# Patient Record
Sex: Female | Born: 1975 | Race: Black or African American | Hispanic: No | Marital: Single | Smoking: Current every day smoker
Health system: Southern US, Community
[De-identification: ages and names within clinical notes are randomized; demographics above are authoritative.]

## PROBLEM LIST (undated history)

## (undated) DIAGNOSIS — G4700 Insomnia, unspecified: Secondary | ICD-10-CM

## (undated) DIAGNOSIS — I10 Essential (primary) hypertension: Secondary | ICD-10-CM

## (undated) DIAGNOSIS — D219 Benign neoplasm of connective and other soft tissue, unspecified: Secondary | ICD-10-CM

## (undated) DIAGNOSIS — F32A Depression, unspecified: Secondary | ICD-10-CM

## (undated) DIAGNOSIS — F329 Major depressive disorder, single episode, unspecified: Secondary | ICD-10-CM

## (undated) DIAGNOSIS — K439 Ventral hernia without obstruction or gangrene: Secondary | ICD-10-CM

## (undated) DIAGNOSIS — E559 Vitamin D deficiency, unspecified: Secondary | ICD-10-CM

## (undated) HISTORY — DX: Vitamin D deficiency, unspecified: E55.9

## (undated) HISTORY — PX: TUBAL LIGATION: SHX77

## (undated) HISTORY — DX: Insomnia, unspecified: G47.00

## (undated) HISTORY — PX: ABDOMINAL HYSTERECTOMY: SHX81

## (undated) HISTORY — DX: Benign neoplasm of connective and other soft tissue, unspecified: D21.9

## (undated) HISTORY — DX: Essential (primary) hypertension: I10

## (undated) HISTORY — PX: OTHER SURGICAL HISTORY: SHX169

## (undated) HISTORY — DX: Depression, unspecified: F32.A

## (undated) HISTORY — DX: Major depressive disorder, single episode, unspecified: F32.9

---

## 2005-06-17 ENCOUNTER — Emergency Department: Payer: Self-pay | Admitting: Emergency Medicine

## 2005-06-26 ENCOUNTER — Emergency Department: Payer: Self-pay | Admitting: Emergency Medicine

## 2006-05-06 ENCOUNTER — Emergency Department: Payer: Self-pay | Admitting: Emergency Medicine

## 2007-11-22 ENCOUNTER — Emergency Department: Payer: Self-pay | Admitting: Emergency Medicine

## 2009-04-09 ENCOUNTER — Emergency Department: Payer: Self-pay | Admitting: Emergency Medicine

## 2013-10-31 ENCOUNTER — Ambulatory Visit: Payer: Self-pay | Admitting: Emergency Medicine

## 2013-10-31 LAB — RAPID INFLUENZA A&B ANTIGENS

## 2015-02-01 ENCOUNTER — Encounter: Payer: Self-pay | Admitting: Family Medicine

## 2015-02-01 ENCOUNTER — Ambulatory Visit (INDEPENDENT_AMBULATORY_CARE_PROVIDER_SITE_OTHER): Payer: BLUE CROSS/BLUE SHIELD | Admitting: Family Medicine

## 2015-02-01 VITALS — BP 138/96 | HR 96 | Temp 98.4°F | Resp 18 | Ht 65.25 in | Wt 203.1 lb

## 2015-02-01 DIAGNOSIS — E669 Obesity, unspecified: Secondary | ICD-10-CM

## 2015-02-01 DIAGNOSIS — G4700 Insomnia, unspecified: Secondary | ICD-10-CM

## 2015-02-01 DIAGNOSIS — R809 Proteinuria, unspecified: Secondary | ICD-10-CM | POA: Insufficient documentation

## 2015-02-01 DIAGNOSIS — R102 Pelvic and perineal pain: Secondary | ICD-10-CM | POA: Diagnosis not present

## 2015-02-01 DIAGNOSIS — R030 Elevated blood-pressure reading, without diagnosis of hypertension: Secondary | ICD-10-CM | POA: Diagnosis not present

## 2015-02-01 DIAGNOSIS — F329 Major depressive disorder, single episode, unspecified: Secondary | ICD-10-CM | POA: Insufficient documentation

## 2015-02-01 DIAGNOSIS — R5383 Other fatigue: Secondary | ICD-10-CM

## 2015-02-01 DIAGNOSIS — R9431 Abnormal electrocardiogram [ECG] [EKG]: Secondary | ICD-10-CM | POA: Diagnosis not present

## 2015-02-01 DIAGNOSIS — IMO0001 Reserved for inherently not codable concepts without codable children: Secondary | ICD-10-CM | POA: Insufficient documentation

## 2015-02-01 DIAGNOSIS — F419 Anxiety disorder, unspecified: Secondary | ICD-10-CM

## 2015-02-01 DIAGNOSIS — F418 Other specified anxiety disorders: Secondary | ICD-10-CM

## 2015-02-01 DIAGNOSIS — Z113 Encounter for screening for infections with a predominantly sexual mode of transmission: Secondary | ICD-10-CM

## 2015-02-01 DIAGNOSIS — Z79899 Other long term (current) drug therapy: Secondary | ICD-10-CM

## 2015-02-01 DIAGNOSIS — E66811 Obesity, class 1: Secondary | ICD-10-CM | POA: Insufficient documentation

## 2015-02-01 DIAGNOSIS — F32A Depression, unspecified: Secondary | ICD-10-CM | POA: Insufficient documentation

## 2015-02-01 LAB — POCT UA - MICROALBUMIN: Microalbumin Ur, POC: 50 mg/L

## 2015-02-01 MED ORDER — CITALOPRAM HYDROBROMIDE 20 MG PO TABS: 20.0000 mg | ORAL_TABLET | Freq: Every day | ORAL | Status: DC

## 2015-02-01 MED ORDER — TRAZODONE HCL 50 MG PO TABS: 25.0000 mg | ORAL_TABLET | Freq: Every evening | ORAL | Status: DC | PRN

## 2015-02-01 NOTE — Patient Instructions (Signed)
Nicotine Addiction Nicotine can act as both a stimulant (excites/activates) and a sedative (calms/quiets). Immediately after exposure to nicotine, there is a "kick" caused in part by the drug's stimulation of the adrenal glands and resulting discharge of adrenaline (epinephrine). The rush of adrenaline stimulates the body and causes a sudden release of sugar. This means that smokers are always slightly hyperglycemic. Hyperglycemic means that the blood sugar is high, just like in diabetics. Nicotine also decreases the amount of insulin which helps control sugar levels in the body. There is an increase in blood pressure, breathing, and the rate of heart beats.  In addition, nicotine indirectly causes a release of dopamine in the brain that controls pleasure and motivation. A similar reaction is seen with other drugs of abuse, such as cocaine and heroin. This dopamine release is thought to cause the pleasurable sensations when smoking. In some different cases, nicotine can also create a calming effect, depending on sensitivity of the smoker's nervous system and the dose of nicotine taken. WHAT HAPPENS WHEN NICOTINE IS TAKEN FOR LONG PERIODS OF TIME?  Long-term use of nicotine results in addiction. It is difficult to stop.  Repeated use of nicotine creates tolerance. Higher doses of nicotine are needed to get the "kick." When nicotine use is stopped, withdrawal may last a month or more. Withdrawal may begin within a few hours after the last cigarette. Symptoms peak within the first few days and may lessen within a few weeks. For some people, however, symptoms may last for months or longer. Withdrawal symptoms include:   Irritability.  Craving.  Learning and attention deficits.  Sleep disturbances.  Increased appetite. Craving for tobacco may last for 6 months or longer. Many behaviors done while using nicotine can also play a part in the severity of withdrawal symptoms. For some people, the feel,  smell, and sight of a cigarette and the ritual of obtaining, handling, lighting, and smoking the cigarette are closely linked with the pleasure of smoking. When stopped, they also miss the related behaviors which make the withdrawal or craving worse. While nicotine gum and patches may lessen the drug aspects of withdrawal, cravings often persist. WHAT ARE THE MEDICAL CONSEQUENCES OF NICOTINE USE?  Nicotine addiction accounts for one-third of all cancers. The top cancer caused by tobacco is lung cancer. Lung cancer is the number one cancer killer of both men and women.  Smoking is also associated with cancers of the:  Mouth.  Pharynx.  Larynx.  Esophagus.  Stomach.  Pancreas.  Cervix.  Kidney.  Ureter.  Bladder.  Smoking also causes lung diseases such as lasting (chronic) bronchitis and emphysema.  It worsens asthma in adults and children.  Smoking increases the risk of heart disease, including:  Stroke.  Heart attack.  Vascular disease.  Aneurysm.  Passive or secondary smoke can also increase medical risks including:  Asthma in children.  Sudden Infant Death Syndrome (SIDS).  Additionally, dropped cigarettes are the leading cause of residential fire fatalities.  Nicotine poisoning has been reported from accidental ingestion of tobacco products by children and pets. Death usually results in a few minutes from respiratory failure (when a person stops breathing) caused by paralysis. TREATMENT   Medication. Nicotine replacement medicines such as nicotine gum and the patch are used to stop smoking. These medicines gradually lower the dosage of nicotine in the body. These medicines do not contain the carbon monoxide and other toxins found in tobacco smoke.  Hypnotherapy.  Relaxation therapy.  Nicotine Anonymous (a 12-step support   program). Find times and locations in your local yellow pages. Document Released: 03/25/2004 Document Revised: 10/12/2011 Document  Reviewed: 09/15/2013 [employer] Patient Information 2015 [employer]. This information is not intended to replace advice given to you by your health care provider. Make sure you discuss any questions you have with your health care provider.  

## 2015-02-01 NOTE — Progress Notes (Signed)
Name: Theresa Sandoval   MRN: [id number]    DOB: 1976-04-08   Date:02/01/2015       Progress Note  Subjective  Chief Complaint  Chief Complaint  Patient presents with  . Mass    In Abdomen-middle of last year, pressure when touching    HPI  Pelvic Mass: she states that about one year ago she noticed a mass in her lower abdomen. It is tender to touch, slow growth, cycles are regular, lasts about 5 -6 days, medium flow, LMP started 2 weeks ago and was a little lighter than usual. Family positive for fibroid tumor (mother )  Depression/Anxiety: she states two years ago , the father of both of her children was cheating on her and she told him to leave.  They lived together for 14 years, she feels overwhelmed by raising two children by herself and hurt by the betrayal. She feels sad, has crying spells, no energy, denies feeling angry, but feels sad.  Insomnia: she can fall asleep but can't stay asleep, symptoms going on for the past couple of years also  Obesity: she states she usually does not weigh herself. She was not obese until adulthood.   Elevated BP: she states her bp has not been high in the past, but was nervous coming here.   Patient Active Problem List   Diagnosis Date Noted  . Obesity (BMI 30.0-34.9) 02/01/2015    Past Surgical History  Procedure Laterality Date  . Tubal ligation      Family History  Problem Relation Age of Onset  . Hypertension Mother   . Hyperlipidemia Mother     History   Social History  . Marital Status: Single    Spouse Name: N/A  . Number of Children: N/A  . Years of Education: N/A   Occupational History  . Not on file.   Social History Main Topics  . Smoking status: Current Every Day Smoker -- 15 years    Types: Cigarettes    Start date: 02/01/2000  . Smokeless tobacco: Never Used  . Alcohol Use: 2.4 oz/week    4 Standard drinks or equivalent per week     Comment: wine/beer   . Drug Use: No  . Sexual Activity:    Partners:  Male    Birth Control/ Protection: Other-see comments     Comment: Tubal ligation   Other Topics Concern  . Not on file   Social History Narrative  . No narrative on file     Current outpatient prescriptions:  .  citalopram (CELEXA) 20 MG tablet, Take 1 tablet (20 mg total) by mouth daily., Disp: 30 tablet, Rfl: 1 .  traZODone (DESYREL) 50 MG tablet, Take 0.5-1 tablets (25-50 mg total) by mouth at bedtime as needed for sleep., Disp: 30 tablet, Rfl: 1  No Known Allergies   ROS  Constitutional: Negative for fever or weight change.  Positive for fatigue Respiratory: Negative for cough and shortness of breath.   Cardiovascular: Negative for chest pain or palpitations.  Gastrointestinal: Negative for bowel changes, positive for a mass.  Musculoskeletal: Negative for gait problem or joint swelling.  Skin: Negative for rash.  Neurological: Negative for dizziness or headache.  No other specific complaints in a complete review of systems (except as listed in HPI above).  Objective  Filed Vitals:   02/01/15 1459  BP: 138/96  Pulse: 96  Temp: 98.4 F (36.9 C)  TempSrc: Oral  Resp: 18  Height: 5' 5.25" (1.657 m)  Weight:  203 lb 1.6 oz (92.126 kg)  SpO2: 96%    Body mass index is 33.55 kg/(m^2).  Physical Exam  Constitutional: Patient appears well-developed and well-nourished. No distress. Obese Eyes:  No scleral icterus. PERL Neck: Normal range of motion. Neck supple. Cardiovascular: Normal rate, regular rhythm and normal heart sounds.  No murmur heard. No BLE edema. Pulmonary/Chest: Effort normal and breath sounds normal. No respiratory distress. Abdominal: Soft.  There is a large , palpable mass on right lower quadrant that moves to supra pubic area, tender to touch, firm in consistency. Psychiatric: Patient has a normal affect. behavior is normal. Judgment and thought content normal. Sad and cried during her visit    PHQ2/9: Depression screen [facility] 2/9 02/01/2015  02/01/2015  Decreased Interest 3 0  Down, Depressed, Hopeless 2 0  PHQ - 2 Score 5 0  Altered sleeping 3 -  Tired, decreased energy 3 -  Change in appetite 0 -  Feeling bad or failure about yourself  0 -  Trouble concentrating 0 -  Moving slowly or fidgety/restless 0 -  Suicidal thoughts 0 -  PHQ-9 Score 11 -  Difficult doing work/chores Somewhat difficult -     Fall Risk: Fall Risk  02/01/2015  Falls in the past year? No     Assessment & Plan  1. Pelvic pain in female  - US Pelvis Complete; Future  2. Obesity (BMI 30.0-34.9) Change life style   3. Other fatigue  - Vitamin D (25 hydroxy) - B12  4. Screen for STD (sexually transmitted disease)  - Lipid Profile - HIV antibody (with reflex) - GC/chlamydia probe amp, urine - RPR  5. Depression with anxiety  - citalopram (CELEXA) 20 MG tablet; Take 1 tablet (20 mg total) by mouth daily.  Dispense: 30 tablet; Refill: 1  6. Elevated blood pressure Patient denies hypertension, but advised to check bp at local pharmacy and to return sooner if it remains above 140/90. We will check labs   - Comprehensive Metabolic Panel (CMET) - TSH - CBC - EKG 12-Lead  7. Insomnia  - traZODone (DESYREL) 50 MG tablet; Take 0.5-1 tablets (25-50 mg total) by mouth at bedtime as needed for sleep.  Dispense: 30 tablet; Refill: 1  8. High risk medication use Trazodone and Celexa can prolong QT - EKG 12-Lead   9. Abnormal EKG Q wave on septal lead, no previous history of chest pain, asymptomatic today, no family history of early MI, discussed referral to cardiologist or montior bp and she chose the later for now   10. Microalbuminuria She had elevated bp also, needs to monitor bp , stop smoking and recheck next visit

## 2015-02-02 LAB — LIPID PANEL
CHOL/HDL RATIO: 4.3 ratio (ref 0.0–4.4)
CHOLESTEROL TOTAL: 185 mg/dL (ref 100–199)
HDL: 43 mg/dL (ref >39)
LDL Calculated: 103 mg/dL — ABNORMAL HIGH (ref 0–99)
TRIGLYCERIDES: 197 mg/dL — AB (ref 0–149)
VLDL CHOLESTEROL CAL: 39 mg/dL (ref 5–40)

## 2015-02-02 LAB — CBC
HEMATOCRIT: 40.2 % (ref 34.0–46.6)
HEMOGLOBIN: 13.7 g/dL (ref 11.1–15.9)
MCH: 32.7 pg (ref 26.6–33.0)
MCHC: 34.1 g/dL (ref 31.5–35.7)
MCV: 96 fL (ref 79–97)
Platelets: 271 10*3/uL (ref 150–379)
RBC: 4.19 x10E6/uL (ref 3.77–5.28)
RDW: 13.8 % (ref 12.3–15.4)
WBC: 10.1 10*3/uL (ref 3.4–10.8)

## 2015-02-02 LAB — COMPREHENSIVE METABOLIC PANEL
ALK PHOS: 67 IU/L (ref 39–117)
ALT: 12 IU/L (ref 0–32)
AST: 15 IU/L (ref 0–40)
Albumin/Globulin Ratio: 1.5 (ref 1.1–2.5)
Albumin: 4.2 g/dL (ref 3.5–5.5)
BUN / CREAT RATIO: 9 (ref 8–20)
BUN: 9 mg/dL (ref 6–20)
Bilirubin Total: 0.5 mg/dL (ref 0.0–1.2)
CALCIUM: 9.3 mg/dL (ref 8.7–10.2)
CO2: 24 mmol/L (ref 18–29)
Chloride: 99 mmol/L (ref 97–108)
Creatinine, Ser: 0.98 mg/dL (ref 0.57–1.00)
GFR calc Af Amer: 85 mL/min/{1.73_m2} (ref >59)
GFR calc non Af Amer: 73 mL/min/{1.73_m2} (ref >59)
Globulin, Total: 2.8 g/dL (ref 1.5–4.5)
Glucose: 101 mg/dL — ABNORMAL HIGH (ref 65–99)
Potassium: 4.3 mmol/L (ref 3.5–5.2)
Sodium: 137 mmol/L (ref 134–144)
Total Protein: 7 g/dL (ref 6.0–8.5)

## 2015-02-02 LAB — VITAMIN B12: Vitamin B-12: 407 pg/mL (ref 211–946)

## 2015-02-02 LAB — VITAMIN D 25 HYDROXY (VIT D DEFICIENCY, FRACTURES): Vit D, 25-Hydroxy: 10 ng/mL — ABNORMAL LOW (ref 30.0–100.0)

## 2015-02-02 LAB — TSH: TSH: 0.699 u[IU]/mL (ref 0.450–4.500)

## 2015-02-02 LAB — RPR: RPR Ser Ql: NONREACTIVE

## 2015-02-02 LAB — HIV ANTIBODY (ROUTINE TESTING W REFLEX): HIV Screen 4th Generation wRfx: NONREACTIVE

## 2015-02-03 ENCOUNTER — Other Ambulatory Visit: Payer: Self-pay | Admitting: Family Medicine

## 2015-02-03 DIAGNOSIS — R739 Hyperglycemia, unspecified: Secondary | ICD-10-CM

## 2015-02-05 ENCOUNTER — Other Ambulatory Visit: Payer: Self-pay | Admitting: Family Medicine

## 2015-02-05 DIAGNOSIS — E559 Vitamin D deficiency, unspecified: Secondary | ICD-10-CM

## 2015-02-05 MED ORDER — VITAMIN D (ERGOCALCIFEROL) 1.25 MG (50000 UNIT) PO CAPS: 50000.0000 [IU] | ORAL_CAPSULE | ORAL | Status: DC

## 2015-02-05 NOTE — Progress Notes (Signed)
Patient notified of Vitamin D prescription and lab results. Patient states she is not checking her BP at home and informed her due to her Protein being high in her urine, Dr. Ancil Boozer would like her to keep a check on it and call us back with her BP results.

## 2015-02-05 NOTE — Progress Notes (Signed)
Added on Lab to [place].

## 2015-02-06 LAB — SPECIMEN STATUS REPORT

## 2015-02-06 LAB — HGB A1C W/O EAG: Hgb A1c MFr Bld: 5.7 % — ABNORMAL HIGH (ref 4.8–5.6)

## 2015-02-08 LAB — SPECIMEN STATUS REPORT

## 2015-02-08 LAB — GC/CHLAMYDIA PROBE AMP
Chlamydia trachomatis, NAA: NEGATIVE
Neisseria gonorrhoeae by PCR: NEGATIVE

## 2015-02-11 ENCOUNTER — Telehealth: Payer: Self-pay

## 2015-02-11 NOTE — Telephone Encounter (Signed)
Left patient vm to return my call for her labs

## 2015-02-11 NOTE — Telephone Encounter (Signed)
-----   Message from Steele Sizer, MD sent at 02/10/2015  5:02 PM EDT ----- genprobe negative

## 2015-02-12 NOTE — Progress Notes (Signed)
Patient notified

## 2015-02-13 ENCOUNTER — Ambulatory Visit: Payer: BLUE CROSS/BLUE SHIELD

## 2015-02-19 ENCOUNTER — Other Ambulatory Visit: Payer: Self-pay | Admitting: Family Medicine

## 2015-02-19 ENCOUNTER — Ambulatory Visit
Admission: RE | Admit: 2015-02-19 | Discharge: 2015-02-19 | Disposition: A | Discharge status: Home / Self Care | Payer: BLUE CROSS/BLUE SHIELD | Source: Ambulatory Visit | Attending: Family Medicine | Admitting: Family Medicine

## 2015-02-19 DIAGNOSIS — N888 Other specified noninflammatory disorders of cervix uteri: Secondary | ICD-10-CM | POA: Insufficient documentation

## 2015-02-19 DIAGNOSIS — Z86018 Personal history of other benign neoplasm: Secondary | ICD-10-CM | POA: Insufficient documentation

## 2015-02-19 DIAGNOSIS — D259 Leiomyoma of uterus, unspecified: Secondary | ICD-10-CM | POA: Insufficient documentation

## 2015-02-19 DIAGNOSIS — R102 Pelvic and perineal pain: Secondary | ICD-10-CM | POA: Diagnosis present

## 2015-02-19 DIAGNOSIS — R739 Hyperglycemia, unspecified: Secondary | ICD-10-CM

## 2015-02-20 ENCOUNTER — Telehealth: Payer: Self-pay

## 2015-02-20 NOTE — Telephone Encounter (Signed)
Pt notified please send referral to [facility]

## 2015-03-15 ENCOUNTER — Encounter: Payer: Self-pay | Admitting: Family Medicine

## 2015-03-15 ENCOUNTER — Ambulatory Visit (INDEPENDENT_AMBULATORY_CARE_PROVIDER_SITE_OTHER): Payer: BLUE CROSS/BLUE SHIELD | Admitting: Family Medicine

## 2015-03-15 DIAGNOSIS — G4700 Insomnia, unspecified: Secondary | ICD-10-CM

## 2015-03-15 DIAGNOSIS — R7303 Prediabetes: Secondary | ICD-10-CM | POA: Insufficient documentation

## 2015-03-15 DIAGNOSIS — R7309 Other abnormal glucose: Secondary | ICD-10-CM

## 2015-03-15 DIAGNOSIS — Z720 Tobacco use: Secondary | ICD-10-CM

## 2015-03-15 DIAGNOSIS — E559 Vitamin D deficiency, unspecified: Secondary | ICD-10-CM | POA: Diagnosis not present

## 2015-03-15 DIAGNOSIS — D259 Leiomyoma of uterus, unspecified: Secondary | ICD-10-CM | POA: Diagnosis not present

## 2015-03-15 DIAGNOSIS — Z23 Encounter for immunization: Secondary | ICD-10-CM

## 2015-03-15 DIAGNOSIS — F418 Other specified anxiety disorders: Secondary | ICD-10-CM

## 2015-03-15 MED ORDER — TRAZODONE HCL 50 MG PO TABS: 25.0000 mg | ORAL_TABLET | Freq: Every evening | ORAL | Status: DC | PRN

## 2015-03-15 MED ORDER — CITALOPRAM HYDROBROMIDE 20 MG PO TABS: 20.0000 mg | ORAL_TABLET | Freq: Every day | ORAL | Status: DC

## 2015-03-15 MED ORDER — BUPROPION HCL ER (XL) 150 MG PO TB24: 150.0000 mg | ORAL_TABLET | Freq: Every day | ORAL | Status: DC

## 2015-03-15 NOTE — Patient Instructions (Signed)
Start Wellbutrin and pick a quit date about two weeks from now, discuss with family and friends for a support system  Nicotine Addiction Nicotine can act as both a stimulant (excites/activates) and a sedative (calms/quiets). Immediately after exposure to nicotine, there is a "kick" caused in part by the drug's stimulation of the adrenal glands and resulting discharge of adrenaline (epinephrine). The rush of adrenaline stimulates the body and causes a sudden release of sugar. This means that smokers are always slightly hyperglycemic. Hyperglycemic means that the blood sugar is high, just like in diabetics. Nicotine also decreases the amount of insulin which helps control sugar levels in the body. There is an increase in blood pressure, breathing, and the rate of heart beats.  In addition, nicotine indirectly causes a release of dopamine in the brain that controls pleasure and motivation. A similar reaction is seen with other drugs of abuse, such as cocaine and heroin. This dopamine release is thought to cause the pleasurable sensations when smoking. In some different cases, nicotine can also create a calming effect, depending on sensitivity of the smoker's nervous system and the dose of nicotine taken. WHAT HAPPENS WHEN NICOTINE IS TAKEN FOR LONG PERIODS OF TIME?  Long-term use of nicotine results in addiction. It is difficult to stop.  Repeated use of nicotine creates tolerance. Higher doses of nicotine are needed to get the "kick." When nicotine use is stopped, withdrawal may last a month or more. Withdrawal may begin within a few hours after the last cigarette. Symptoms peak within the first few days and may lessen within a few weeks. For some people, however, symptoms may last for months or longer. Withdrawal symptoms include:   Irritability.  Craving.  Learning and attention deficits.  Sleep disturbances.  Increased appetite. Craving for tobacco may last for 6 months or longer. Many  behaviors done while using nicotine can also play a part in the severity of withdrawal symptoms. For some people, the feel, smell, and sight of a cigarette and the ritual of obtaining, handling, lighting, and smoking the cigarette are closely linked with the pleasure of smoking. When stopped, they also miss the related behaviors which make the withdrawal or craving worse. While nicotine gum and patches may lessen the drug aspects of withdrawal, cravings often persist. WHAT ARE THE MEDICAL CONSEQUENCES OF NICOTINE USE?  Nicotine addiction accounts for one-third of all cancers. The top cancer caused by tobacco is lung cancer. Lung cancer is the number one cancer killer of both men and women.  Smoking is also associated with cancers of the:  Mouth.  Pharynx.  Larynx.  Esophagus.  Stomach.  Pancreas.  Cervix.  Kidney.  Ureter.  Bladder.  Smoking also causes lung diseases such as lasting (chronic) bronchitis and emphysema.  It worsens asthma in adults and children.  Smoking increases the risk of heart disease, including:  Stroke.  Heart attack.  Vascular disease.  Aneurysm.  Passive or secondary smoke can also increase medical risks including:  Asthma in children.  Sudden Infant Death Syndrome (SIDS).  Additionally, dropped cigarettes are the leading cause of residential fire fatalities.  Nicotine poisoning has been reported from accidental ingestion of tobacco products by children and pets. Death usually results in a few minutes from respiratory failure (when a person stops breathing) caused by paralysis. TREATMENT   Medication. Nicotine replacement medicines such as nicotine gum and the patch are used to stop smoking. These medicines gradually lower the dosage of nicotine in the body. These medicines do not  contain the carbon monoxide and other toxins found in tobacco smoke.  Hypnotherapy.  Relaxation therapy.  Nicotine Anonymous (a 12-step support program).  Find times and locations in your local yellow pages. Document Released: 03/25/2004 Document Revised: 10/12/2011 Document Reviewed: 09/15/2013 [facility] Patient Information 2015 [place], [place]. This information is not intended to replace advice given to you by your health care provider. Make sure you discuss any questions you have with your health care provider.     Diabetes Mellitus and Food It is important for you to manage your blood sugar (glucose) level. Your blood glucose level can be greatly affected by what you eat. Eating healthier foods in the appropriate amounts throughout the day at about the same time each day will help you control your blood glucose level. It can also help slow or prevent worsening of your diabetes mellitus. Healthy eating may even help you improve the level of your blood pressure and reach or maintain a healthy weight.  HOW CAN FOOD AFFECT ME? Carbohydrates Carbohydrates affect your blood glucose level more than any other type of food. Your dietitian will help you determine how many carbohydrates to eat at each meal and teach you how to count carbohydrates. Counting carbohydrates is important to keep your blood glucose at a healthy level, especially if you are using insulin or taking certain medicines for diabetes mellitus. Alcohol Alcohol can cause sudden decreases in blood glucose (hypoglycemia), especially if you use insulin or take certain medicines for diabetes mellitus. Hypoglycemia can be a life-threatening condition. Symptoms of hypoglycemia (sleepiness, dizziness, and disorientation) are similar to symptoms of having too much alcohol.  If your health care provider has given you approval to drink alcohol, do so in moderation and use the following guidelines:  Women should not have more than one drink per day, and men should not have more than two drinks per day. One drink is equal to:  12 oz of beer.  5 oz of wine.  1 oz of hard liquor.  Do not drink on  an empty stomach.  Keep yourself hydrated. Have water, diet soda, or unsweetened iced tea.  Regular soda, juice, and other mixers might contain a lot of carbohydrates and should be counted. WHAT FOODS ARE NOT RECOMMENDED? As you make food choices, it is important to remember that all foods are not the same. Some foods have fewer nutrients per serving than other foods, even though they might have the same number of calories or carbohydrates. It is difficult to get your body what it needs when you eat foods with fewer nutrients. Examples of foods that you should avoid that are high in calories and carbohydrates but low in nutrients include:  Trans fats (most processed foods list trans fats on the Nutrition Facts label).  Regular soda.  Juice.  Candy.  Sweets, such as cake, pie, doughnuts, and cookies.  Fried foods. WHAT FOODS CAN I EAT? Have nutrient-rich foods, which will nourish your body and keep you healthy. The food you should eat also will depend on several factors, including:  The calories you need.  The medicines you take.  Your weight.  Your blood glucose level.  Your blood pressure level.  Your cholesterol level. You also should eat a variety of foods, including:  Protein, such as meat, poultry, fish, tofu, nuts, and seeds (lean animal proteins are best).  Fruits.  Vegetables.  Dairy products, such as milk, cheese, and yogurt (low fat is best).  Breads, grains, pasta, cereal, rice, and beans.  Fats such as olive oil, trans fat-free margarine, canola oil, avocado, and olives. DOES EVERYONE WITH DIABETES MELLITUS HAVE THE SAME MEAL PLAN? Because every person with diabetes mellitus is different, there is not one meal plan that works for everyone. It is very important that you meet with a dietitian who will help you create a meal plan that is just right for you. Document Released: 04/16/2005 Document Revised: 07/25/2013 Document Reviewed: 06/16/2013 [facility]  Patient Information 2015 [place], [place]. This information is not intended to replace advice given to you by your health care provider. Make sure you discuss any questions you have with your health care provider.

## 2015-03-15 NOTE — Progress Notes (Signed)
Name: Theresa Sandoval   MRN: [id number]    DOB: Nov 06, 1975   Date:03/15/2015       Progress Note  Subjective  Chief Complaint  Chief Complaint  Patient presents with  . Follow-up    6 week F/U  . Depression  . Insomnia    HPI Fibroid tumors: she states that about one year ago she noticed a mass in her lower abdomen. It is tender to touch, slow growth,cycles are regular, she had a pelvic US that showed multiple large uterine fibroids. She was referred to GYN but has not gotten a call back yet.   Depression/Anxiety: she states two years ago , the father of both of her children was cheating on her and she told him to leave. They lived together for 14 years, she feels overwhelmed by raising two children by herself and hurt by the betrayal. She was started on Citalopram 20mg  but symptoms are still present. Able to cope better with stress, still has crying spells but not daily, still feels sad, but not daily   Insomnia: able to fall and stay asleep with Trazodone and would like refill of medication   Elevated BP: bp is back to normal, urine micro was 50 on her last visit, recheck in a few months  Prediabetes: hgbA1C 5.7% on labs , discussed diet and exercise  Vitamin D deficiency: continue prescription vitamin D and once done take otc vitamin D 2000 daily  Tobacco Cessation: she has been a smoker for about 10 years, smoking about one pack daily , she is willing to try medication to quit today  Patient Active Problem List   Diagnosis Date Noted  . Prediabetes 03/15/2015  . Vitamin D deficiency 03/15/2015  . Tobacco abuse 03/15/2015  . Uterine fibroid 02/19/2015  . Obesity (BMI 30.0-34.9) 02/01/2015  . Anxiety and depression 02/01/2015  . Insomnia 02/01/2015  . EKG, abnormal 02/01/2015  . Microalbuminuria 02/01/2015    Past Surgical History  Procedure Laterality Date  . Tubal ligation      Family History  Problem Relation Age of Onset  . Hypertension Mother   .  Hyperlipidemia Mother     Social History   Social History  . Marital Status: Single    Spouse Name: N/A  . Number of Children: N/A  . Years of Education: N/A   Occupational History  . Not on file.   Social History Main Topics  . Smoking status: Current Every Day Smoker -- 15 years    Types: Cigarettes    Start date: 02/01/2000  . Smokeless tobacco: Never Used  . Alcohol Use: 2.4 oz/week    4 Standard drinks or equivalent per week     Comment: wine/beer   . Drug Use: No  . Sexual Activity:    Partners: Male    Birth Control/ Protection: Other-see comments     Comment: Tubal ligation   Other Topics Concern  . Not on file   Social History Narrative     Current outpatient prescriptions:  .  citalopram (CELEXA) 20 MG tablet, Take 1 tablet (20 mg total) by mouth daily., Disp: 90 tablet, Rfl: .0 .  traZODone (DESYREL) 50 MG tablet, Take 0.5-1 tablets (25-50 mg total) by mouth at bedtime as needed for sleep., Disp: 90 tablet, Rfl: 1 .  Vitamin D, Ergocalciferol, (DRISDOL) 50000 UNITS CAPS capsule, Take 1 capsule (50,000 Units total) by mouth every 7 (seven) days., Disp: 12 capsule, Rfl: 1 .  buPROPion (WELLBUTRIN XL) 150 MG 24  hr tablet, Take 1 tablet (150 mg total) by mouth daily., Disp: 30 tablet, Rfl: 2  No Known Allergies   ROS  Constitutional: Negative for fever or weight change.  Respiratory: Negative for cough and shortness of breath.  Cardiovascular: Negative for chest pain or palpitations.  Gastrointestinal: Negative for bowel changes, positive for a mass.  Musculoskeletal: Negative for gait problem or joint swelling.  Skin: Negative for rash.  Neurological: Negative for dizziness or headache.  No other specific complaints in a complete review of systems (except as listed in HPI above  Objective  Filed Vitals:   03/15/15 1103  BP: 132/84  Pulse: 90  Temp: 98.4 F (36.9 C)  TempSrc: Oral  Resp: 16  Height: 5\' 5"  (1.651 m)  Weight: 202 lb 3.2 oz  (91.717 kg)  SpO2: 98%    Body mass index is 33.65 kg/(m^2).  Physical Exam  Constitutional: Patient appears well-developed . Obese No distress.  HEENT: head atraumatic, normocephalic, pupils equal and reactive to light, earsneck supple, throat within normal limits Cardiovascular: Normal rate, regular rhythm and normal heart sounds.  No murmur heard. No BLE edema. Pulmonary/Chest: Effort normal and breath sounds normal. No respiratory distress. Abdominal: Soft. There is a large , palpable mass on right lower quadrant that moves to supra pubic area, tender to touch, firm in consistency. Psychiatric: Patient has a normal mood and affect. behavior is normal. Judgment and thought content normal.  Recent Results (from the past 2160 hour(s))  Lipid Profile     Status: Abnormal   Collection Time: 02/01/15  4:38 PM  Result Value Ref Range   Cholesterol, Total 185 100 - 199 mg/dL   Triglycerides 197 (H) 0 - 149 mg/dL   HDL 43 >39 mg/dL    Comment: According to ATP-III Guidelines, HDL-C >59 mg/dL is considered a negative risk factor for CHD.    VLDL Cholesterol Cal 39 5 - 40 mg/dL   LDL Calculated 103 (H) 0 - 99 mg/dL   Chol/HDL Ratio 4.3 0.0 - 4.4 ratio units    Comment:                                   T. Chol/HDL Ratio                                             Men  Women                               1/2 Avg.Risk  3.4    3.3                                   Avg.Risk  5.0    4.4                                2X Avg.Risk  9.6    7.1                                3X Avg.Risk 23.4   11.0   Comprehensive Metabolic Panel (CMET)  Status: Abnormal   Collection Time: 02/01/15  4:38 PM  Result Value Ref Range   Glucose 101 (H) 65 - 99 mg/dL   BUN 9 6 - 20 mg/dL   Creatinine, Ser 0.98 0.57 - 1.00 mg/dL   GFR calc non Af Amer 73 >59 mL/min/1.73   GFR calc Af Amer 85 >59 mL/min/1.73   BUN/Creatinine Ratio 9 8 - 20   Sodium 137 134 - 144 mmol/L   Potassium 4.3 3.5 - 5.2 mmol/L    Chloride 99 97 - 108 mmol/L   CO2 24 18 - 29 mmol/L   Calcium 9.3 8.7 - 10.2 mg/dL   Total Protein 7.0 6.0 - 8.5 g/dL   Albumin 4.2 3.5 - 5.5 g/dL   Globulin, Total 2.8 1.5 - 4.5 g/dL   Albumin/Globulin Ratio 1.5 1.1 - 2.5   Bilirubin Total 0.5 0.0 - 1.2 mg/dL   Alkaline Phosphatase 67 39 - 117 IU/L   AST 15 0 - 40 IU/L   ALT 12 0 - 32 IU/L  Vitamin D (25 hydroxy)     Status: Abnormal   Collection Time: 02/01/15  4:38 PM  Result Value Ref Range   Vit D, 25-Hydroxy 10.0 (L) 30.0 - 100.0 ng/mL    Comment: Vitamin D deficiency has been defined by the Institute of Medicine and an Endocrine Society practice guideline as a level of serum 25-OH vitamin D less than 20 ng/mL (1,2). The Endocrine Society went on to further define vitamin D insufficiency as a level between 21 and 29 ng/mL (2). 1. IOM (Institute of Medicine). 2010. Dietary reference    intakes for calcium and D. [place]: The    [employer]. 2. Holick MF, Binkley Humphreys, Bischoff-Ferrari HA, et al.    Evaluation, treatment, and prevention of vitamin D    deficiency: an Endocrine Society clinical practice    guideline. JCEM. 2011 Jul; 96(7):1911-30.   TSH     Status: None   Collection Time: 02/01/15  4:38 PM  Result Value Ref Range   TSH 0.699 0.450 - 4.500 uIU/mL  CBC     Status: None   Collection Time: 02/01/15  4:38 PM  Result Value Ref Range   WBC 10.1 3.4 - 10.8 x10E3/uL   RBC 4.19 3.77 - 5.28 x10E6/uL   Hemoglobin 13.7 11.1 - 15.9 g/dL   Hematocrit 40.2 34.0 - 46.6 %   MCV 96 79 - 97 fL   MCH 32.7 26.6 - 33.0 pg   MCHC 34.1 31.5 - 35.7 g/dL   RDW 13.8 12.3 - 15.4 %   Platelets 271 150 - 379 x10E3/uL  B12     Status: None   Collection Time: 02/01/15  4:38 PM  Result Value Ref Range   Vitamin B-12 407 211 - 946 pg/mL  HIV antibody (with reflex)     Status: None   Collection Time: 02/01/15  4:38 PM  Result Value Ref Range   HIV Screen 4th Generation wRfx Non Reactive Non Reactive  RPR      Status: None   Collection Time: 02/01/15  4:38 PM  Result Value Ref Range   RPR Ser Ql Non Reactive Non Reactive  Hgb A1c w/o eAG     Status: Abnormal   Collection Time: 02/01/15  4:38 PM  Result Value Ref Range   Hgb A1c MFr Bld 5.7 (H) 4.8 - 5.6 %    Comment:          Pre-diabetes: 5.7 - 6.4  Diabetes: >6.4          Glycemic control for adults with diabetes: <7.0   Specimen status report     Status: None   Collection Time: 02/01/15  4:38 PM  Result Value Ref Range   specimen status report Comment     Comment: Written Authorization Written Authorization Written Authorization Received. Authorization received from [employer] 02-06-2015 Logged by Nicholaus Corolla   POCT UA - Microalbumin     Status: None   Collection Time: 02/01/15  4:42 PM  Result Value Ref Range   Microalbumin Ur, POC 50 mg/L   Creatinine, POC  mg/dL   Albumin/Creatinine Ratio, Urine, POC    GC/Chlamydia Probe Amp     Status: None   Collection Time: 02/05/15 12:00 AM  Result Value Ref Range   Chlamydia trachomatis, NAA Negative Negative   Neisseria gonorrhoeae by PCR Negative Negative  Specimen status report     Status: None   Collection Time: 02/05/15 12:00 AM  Result Value Ref Range   specimen status report Comment     Comment: Written Authorization Written Authorization Written Authorization Received. Authorization received from original req 02-07-2015 Logged by Lisabeth Pick Poteat     PHQ2/9: Depression screen [facility] 2/9 02/01/2015 02/01/2015  Decreased Interest 3 0  Down, Depressed, Hopeless 2 0  PHQ - 2 Score 5 0  Altered sleeping 3 -  Tired, decreased energy 3 -  Change in appetite 0 -  Feeling bad or failure about yourself  0 -  Trouble concentrating 0 -  Moving slowly or fidgety/restless 0 -  Suicidal thoughts 0 -  PHQ-9 Score 11 -  Difficult doing work/chores Somewhat difficult -   Adjust medication   Fall Risk: Fall Risk  02/01/2015  Falls in the past year? No      Assessment  & Plan  1. Prediabetes Discussed diet and exercise  2. Vitamin D deficiency Continue medication   3. Uterine leiomyoma, unspecified location Get her to see GYN   4. Tobacco abuse We will try Wellbutrin - buPROPion (WELLBUTRIN XL) 150 MG 24 hr tablet; Take 1 tablet (150 mg total) by mouth daily.  Dispense: 30 tablet; Refill: 2  5. Needs flu shot  - Flu Vaccine QUAD 36+ mos IM  6. Need for Tdap vaccination  - Tdap vaccine greater than or equal to 7yo IM  7. Insomnia Doing well on medication  - traZODone (DESYREL) 50 MG tablet; Take 0.5-1 tablets (25-50 mg total) by mouth at bedtime as needed for sleep.  Dispense: 90 tablet; Refill: 1  8. Depression with anxiety We will add Wellbutrin, continue Celexa, may need counseling - citalopram (CELEXA) 20 MG tablet; Take 1 tablet (20 mg total) by mouth daily.  Dispense: 90 tablet; Refill: .0 - buPROPion (WELLBUTRIN XL) 150 MG 24 hr tablet; Take 1 tablet (150 mg total) by mouth daily.  Dispense: 30 tablet; Refill: 2

## 2015-04-10 ENCOUNTER — Ambulatory Visit (INDEPENDENT_AMBULATORY_CARE_PROVIDER_SITE_OTHER): Payer: BLUE CROSS/BLUE SHIELD | Admitting: Obstetrics and Gynecology

## 2015-04-10 ENCOUNTER — Encounter: Payer: Self-pay | Admitting: Obstetrics and Gynecology

## 2015-04-10 VITALS — BP 159/115 | HR 88 | Ht 65.5 in | Wt 207.3 lb

## 2015-04-10 DIAGNOSIS — N9089 Other specified noninflammatory disorders of vulva and perineum: Secondary | ICD-10-CM

## 2015-04-10 DIAGNOSIS — Z8742 Personal history of other diseases of the female genital tract: Secondary | ICD-10-CM | POA: Insufficient documentation

## 2015-04-10 DIAGNOSIS — D259 Leiomyoma of uterus, unspecified: Secondary | ICD-10-CM

## 2015-04-10 DIAGNOSIS — N920 Excessive and frequent menstruation with regular cycle: Secondary | ICD-10-CM | POA: Diagnosis not present

## 2015-04-10 NOTE — Patient Instructions (Signed)
1. Return in 1 week for Vulvar biopsy. 2. Endometrial biopsy was done today. 3. Pamphlets are given on Fibroids and vulvar disorders.

## 2015-04-10 NOTE — Progress Notes (Signed)
Patient ID: Theresa Sandoval, female   DOB: Jan 02, 1976, 39 y.o.   MRN: [id number] Refer from dr Theresa Sandoval  Ut fibroids- see u/s Painful and heavy bleeding   NEW PATIENT EVALUATION  Referring physician: Dr. Steele Sandoval Chief complaint: 1.  Menorrhagia. 2.  Uterine fibroids.  The patient is a 39 year old single African-American female, para 2012, using BTL for contraception, who presents in referral from Dr. Ancil Sandoval for evaluation of symptomatic fibroid uterus.  Patient has noted at least a 3 month history of lower abdominal pain which is tender to the touch and while lying on her stomach.  She is not currently sexually active. Patient reports heavy menstrual cycles lasting approximately 9 days with 5 days of heavy flow and 4 days of lighter spotting.  She does not have significant menstrual cramps.  Recent ultrasound demonstrates a multi-fibroid uterus with an exophytic fibroid, a submucosal fibroid, and intramural fibroid.  Past GYN history: Menarche: Age [age] years. Regular menstrual cycles monthly. Menses, heavy 5 days with 4 days of light spotting. No significant dysmenorrhea. No history of abnormal Pap smears.  Past OB history: Para 2012. G1 SVD 7 pounds, G2, TAB, uncomplicated. G3 SVD, 6 pounds  Past Medical History  Diagnosis Date  . Fibroids   . Depression   . Insomnia   . Vitamin D deficiency    Past Surgical History  Procedure Laterality Date  . Tubal ligation      Review of Systems  Constitutional: Negative.   Respiratory: Negative.   Cardiovascular: Negative.   Gastrointestinal: Positive for abdominal pain.  Genitourinary: Negative.   Musculoskeletal: Negative.   Skin: Negative.    OBJECTIVE: BP 159/115 mmHg  Pulse 88  Ht 5' 5.5" (1.664 m)  Wt 207 lb 4.8 oz (94.031 kg)  BMI 33.96 kg/m2  LMP 04/06/2015  Pleasant, well-appearing African female in no acute distress. Alert and oriented. Back: No CVA tenderness. Abdomen: Soft, nontender, without  organomegaly.  Palpable central lower pelvic mass approximately 18 weeks size, mobile, 1/4 tender. Pelvic: External genitalia-1 cm nodular mass with erosion at the left posterior fourchette, nonfriable, nontender. BUS-normal. Vagina-normal estrogen effect; no lesions. Cervix-parous, no lesions, no cervical motion tenderness. Uterus-irregular, mobile, supported high in the vaginal vault and measuring approximately 18 weeks size, 2/4 tender. Adnexa-nonpalpable and nontender. Rectovaginal-not done. Extremities: Without clubbing, cyanosis or edema. Skin: No rash.  IMPRESSION: 1.  Symptomatic multi-fibroid uterus, 18 weeks size. 2.  Menorrhagia. 3.  Vulvar lesion, 1 cm, left side of introitus, erosive.  PLAN: 1.  Endometrial biopsy. 2.  Ultrasound reviewed. 3.  Return in 1 week for follow-up and biopsy of vulvar lesion  Theresa Mars, MD   Endometrial Biopsy Procedure Note  Pre-operative Diagnosis:  1.  Menorrhagia. 2.  Multi-fibroid uterus, 18 weeks size  Post-operative Diagnosis:  Same as above  Procedure Details   Urine pregnancy test was not done.  The risks (including infection, bleeding, pain, and uterine perforation) and benefits of the procedure were explained to the patient and Verbal informed consent was obtained.  Antibiotic prophylaxis against endocarditis was not indicated.   The patient was placed in the dorsal lithotomy position.  Bimanual exam showed the uterus to be in the neutral position.  A Graves' speculum inserted in the vagina, and the cervix prepped with povidone iodine.  Endocervical curettage with a Kevorkian curette was not performed.   A sharp tenaculum was applied to the anterior lip of the cervix for stabilization.  A sterile uterine sound was used to sound  the uterus to a depth of 10 cm.  A Mylex 30mm curette was used to sample the endometrium.  Sample was sent for pathologic  examination.  Condition: Stable  Complications: None  Plan:  The patient was advised to call for any fever or for prolonged or severe pain or bleeding. She was advised to use OTC acetaminophen and OTC ibuprofen as needed for mild to moderate pain. She was advised to avoid vaginal intercourse for 48 hours or until the bleeding has completely stopped.  Attending Physician Documentation: Theresa Mars, MD

## 2015-04-16 ENCOUNTER — Ambulatory Visit (INDEPENDENT_AMBULATORY_CARE_PROVIDER_SITE_OTHER): Payer: BLUE CROSS/BLUE SHIELD | Admitting: Obstetrics and Gynecology

## 2015-04-16 ENCOUNTER — Encounter: Payer: Self-pay | Admitting: Obstetrics and Gynecology

## 2015-04-16 VITALS — BP 172/116 | HR 88 | Ht 65.0 in | Wt 206.0 lb

## 2015-04-16 DIAGNOSIS — N9089 Other specified noninflammatory disorders of vulva and perineum: Secondary | ICD-10-CM | POA: Diagnosis not present

## 2015-04-16 DIAGNOSIS — N920 Excessive and frequent menstruation with regular cycle: Secondary | ICD-10-CM

## 2015-04-16 LAB — PATHOLOGY

## 2015-04-16 MED ORDER — OXYCODONE-ACETAMINOPHEN 5-325 MG PO TABS: 1.0000 | ORAL_TABLET | ORAL | Status: DC | PRN

## 2015-04-16 NOTE — Patient Instructions (Signed)
1.  Vulvar biopsy done. 2.  Return in 1 week for follow-up with Dr. Marcelline Mates. 3.  Patient may do sitz baths as needed; avoid scrubbing the incision. 4.  Return for fever or heavy bleeding at the incision site.. 5.  Endometrial biopsy was benign.

## 2015-04-16 NOTE — Addendum Note (Signed)
Addended by: Elouise Munroe on: 04/16/2015 02:28 PM   Modules accepted: Orders

## 2015-04-16 NOTE — Progress Notes (Signed)
Patient ID: Theresa Sandoval, female   DOB: [birth year], 39 y.o.   MRN: [id number] emb results and vulvar bx   Chief complaint: 1.  Menorrhagia. 2.  Uterine fibroids. 3.  Vulvar lesion.  Patient presents for follow up on endometrial biopsy and for excision of the superior fourchette lesion identified last week.  Pathology from endometrial biopsy was benign without evidence of hyperplasia or carcinoma.  Patient ID: Theresa Sandoval, female   DOB: February 22, 1976, 39 y.o.   MRN: [id number]  Chief Complaint  Patient presents with  . Results    emb and vulvar bx    HPI Theresa Sandoval is a 40 y.o. female.  History of menorrhagia and uterine fibroids  HPI Indication: Papule withulceration of the vulva Symptoms:   none  Location:  perineum  Past Medical History  Diagnosis Date  . Fibroids   . Depression   . Insomnia   . Vitamin D deficiency     Past Surgical History  Procedure Laterality Date  . Tubal ligation      Family History  Problem Relation Age of Onset  . Hypertension Mother   . Hyperlipidemia Mother   . Breast cancer Maternal Grandmother   . Diabetes Maternal Grandmother   . Ovarian cancer Neg Hx   . Colon cancer Neg Hx   . Heart disease Neg Hx     Social History Social History  Substance Use Topics  . Smoking status: Current Every Day Smoker -- 0.50 packs/day for 15 years    Types: Cigarettes    Start date: 02/01/2000  . Smokeless tobacco: Never Used  . Alcohol Use: 2.4 oz/week    4 Standard drinks or equivalent per week     Comment: wine/beer     No Known Allergies  Current Outpatient Prescriptions  Medication Sig Dispense Refill  . buPROPion (WELLBUTRIN XL) 150 MG 24 hr tablet Take 1 tablet (150 mg total) by mouth daily. 30 tablet 2  . citalopram (CELEXA) 20 MG tablet Take 1 tablet (20 mg total) by mouth daily. 90 tablet .0  . traZODone (DESYREL) 50 MG tablet Take 0.5-1 tablets (25-50 mg total) by mouth at bedtime as needed for sleep. 90 tablet 1   . Vitamin D, Ergocalciferol, (DRISDOL) 50000 UNITS CAPS capsule Take 1 capsule (50,000 Units total) by mouth every 7 (seven) days. 12 capsule 1  . oxyCODONE-acetaminophen (ROXICET) 5-325 MG per tablet Take 1-2 tablets by mouth every 4 (four) hours as needed for severe pain. 15 tablet 0   No current facility-administered medications for this visit.    Review of Systems Review of Systems  Blood pressure 172/116, pulse 88, height 5\' 5"  (1.651 m), weight 206 lb (93.441 kg), last menstrual period 04/06/2015.  Physical Exam Physical Exam  Well-appearing African American female in no acute distress. Pelvic exam: External genitalia-1 cm papular, ulcerated lesion noted at the left side of the posterior fourchette just above the anus.  Data Reviewed Endometrial biopsy results reviewed-benign  Assessment   1.  Menorrhagia; benign endometrial biopsy. 2.  Vulvar lesion.  DESCRIPTION OF PROCEDURE: Prepping with Betadine Local anesthesia with 1% Buffered Lidocaine Scalpel was used to make elliptical excision of lesion.  5.  #3-0 Vicryl sutures are used for hemostasis in a simple interrupted manner EBL 15 cc Well tolerated  Specimen appropriately identified and sent to pathology    Plan   1.  Local wound care discussed. 2.  Percocet/Advil when necessary for pain. 3.Follow-up:  1 week With Dr. Marcelline Mates (  I am out of town)      [name] 04/16/2015, 11:56 AM    A total of 15 minutes were spent face-to-face with the patient during this encounter and over half of that time dealt with counseling and coordination of care.

## 2015-04-23 ENCOUNTER — Telehealth: Payer: Self-pay | Admitting: Obstetrics and Gynecology

## 2015-04-23 NOTE — Telephone Encounter (Signed)
[facility] called and said they sent her slides to [facility] for a second opinion.

## 2015-04-25 ENCOUNTER — Ambulatory Visit (INDEPENDENT_AMBULATORY_CARE_PROVIDER_SITE_OTHER): Payer: BLUE CROSS/BLUE SHIELD | Admitting: Obstetrics and Gynecology

## 2015-04-25 ENCOUNTER — Encounter: Payer: Self-pay | Admitting: Obstetrics and Gynecology

## 2015-04-25 VITALS — BP 164/109 | HR 83 | Ht 65.0 in | Wt 203.2 lb

## 2015-04-25 DIAGNOSIS — Z9889 Other specified postprocedural states: Secondary | ICD-10-CM

## 2015-04-25 DIAGNOSIS — N9089 Other specified noninflammatory disorders of vulva and perineum: Secondary | ICD-10-CM | POA: Diagnosis not present

## 2015-04-27 NOTE — Progress Notes (Signed)
GYNECOLOGY PROGRESS NOTE  Subjective:    Patient ID: Theresa Sandoval, female    DOB: [birth year], 39 y.o.   MRN: [id number]  HPI  Patient is a 39 y.o. G2P2011 female who presents for f/u of biopsy results of vulvar lesion (results had not returned last week when patient was also following up for EMB  results). Patient reports that her vulvar incision site "feels as though it is not healing properly. Is having a small amount of yellowish discharge (no odor, no fevers/chills).  The following portions of the patient's history were reviewed and updated as appropriate: allergies, current medications, past family history, past medical history, past social history, past surgical history and problem list.  Review of Systems Pertinent items are noted in HPI.   Objective:   Blood pressure 164/109, pulse 83, height 5\' 5"  (1.651 m), weight 203 lb 3.2 oz (92.171 kg), last menstrual period 04/06/2015. General appearance: alert and no distress Abdomen: soft, non-tender; bowel sounds normal; no masses,  no organomegaly Pelvic: external genitalia with elliptical biopsy site noted at posterior fourchette (more left lateral).  Sutures disrupted, scant discharge noted at biopsy site, yellowish, no odor. Biopsy site ~ 2.5-3.0 cm.  Extremities: extremities normal, atraumatic, no cyanosis or edema Neurologic: Grossly normal   Pathology:  Pathology exported to third party for verification of results.   Assessment:   Vulvar lesion Wound separation due to interruption of suture integrity  Plan:   1. Discussion had with patient regarding wound care.  2. Reapproximated wound edges with 2-0 Vicryl (see procedure below):  DESCRIPTION OF PROCEDURE: Prepped with Betadine Local anesthesia with 1% Buffered Lidocaine #2-0 Vicryl sutures are used for for tissue reapproximation in a simple interrupted manner EBL 5cc Well tolerated 3. Awaiting pathology report still (Pathology was sent to [facility] for further analysis).    4. Patient to f/u in 2 weeks for discussion of pathology and f/u of biopsy site.    Rubie Maid, MD [facility]

## 2015-05-02 LAB — PATHOLOGY

## 2015-05-09 ENCOUNTER — Ambulatory Visit (INDEPENDENT_AMBULATORY_CARE_PROVIDER_SITE_OTHER): Payer: BLUE CROSS/BLUE SHIELD | Admitting: Obstetrics and Gynecology

## 2015-05-09 ENCOUNTER — Encounter: Payer: Self-pay | Admitting: Obstetrics and Gynecology

## 2015-05-09 ENCOUNTER — Ambulatory Visit: Payer: BLUE CROSS/BLUE SHIELD | Admitting: Obstetrics and Gynecology

## 2015-05-09 VITALS — BP 157/100 | HR 82 | Wt 203.2 lb

## 2015-05-09 DIAGNOSIS — N920 Excessive and frequent menstruation with regular cycle: Secondary | ICD-10-CM

## 2015-05-09 DIAGNOSIS — D071 Carcinoma in situ of vulva: Secondary | ICD-10-CM

## 2015-05-09 DIAGNOSIS — D259 Leiomyoma of uterus, unspecified: Secondary | ICD-10-CM

## 2015-05-09 DIAGNOSIS — N9089 Other specified noninflammatory disorders of vulva and perineum: Secondary | ICD-10-CM

## 2015-05-09 NOTE — Progress Notes (Signed)
Patient ID: Theresa Sandoval, female   DOB: [birth year], 39 y.o.   MRN: [id number] 3 week f/u vulvar bx See [facility] note- had to re stitch   Chief complaint: 1.  Menorrhagia. 2.  Uterine fibroids. 3.  Vulvar lesion.  Patient presents today for follow-up management planning regarding the above complaints. MENORRHAGIA: Cyclic heavy bleeding is noted; endometrial biopsy was performed; pathology was benign. UTERINE FIBROIDS: Uterus is 18 weeks size, mobile, well supported out of the pelvis. 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  The biopsy and superficial stitch separation, which required re-suturing.  The wound today is healing well.  On inspection and palpation.  IMPRESSION: 1.  Symptomatic 18 week size multi-fibroid uterus; benign endometrial biopsy; TAH through midline incision is recommended and will be planned for February 2017 2.  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  PLAN: 1.  TAH, bilateral salpingectomy in February 2017 2.  Return in 3 months for vulvar colposcopy.  Following complete healing of vulvar biopsy.  A total of 15 minutes were spent face-to-face with the patient during this encounter and over half of that time dealt with counseling and coordination of care.  [name], MD

## 2015-05-09 NOTE — Patient Instructions (Signed)
1.  Endometrial biopsy (uterine) benign. 2.  Plan to proceed with total abdominal hysterectomy and bilateral salpingectomy in February 2016 (remove uterus and tubes). 3.  Vulvar biopsy shows a  PRE-Cancer.  Further evaluation will be done in 3 months with  Colposcopy/.

## 2015-05-21 ENCOUNTER — Ambulatory Visit: Payer: BLUE CROSS/BLUE SHIELD | Admitting: Family Medicine

## 2015-08-21 ENCOUNTER — Encounter: Payer: BLUE CROSS/BLUE SHIELD | Admitting: Obstetrics and Gynecology

## 2015-09-04 ENCOUNTER — Ambulatory Visit (INDEPENDENT_AMBULATORY_CARE_PROVIDER_SITE_OTHER): Payer: BLUE CROSS/BLUE SHIELD | Admitting: Obstetrics and Gynecology

## 2015-09-04 ENCOUNTER — Encounter: Payer: Self-pay | Admitting: Obstetrics and Gynecology

## 2015-09-04 VITALS — BP 168/96 | HR 84 | Ht 65.0 in | Wt 211.2 lb

## 2015-09-04 DIAGNOSIS — N9089 Other specified noninflammatory disorders of vulva and perineum: Secondary | ICD-10-CM

## 2015-09-04 DIAGNOSIS — D071 Carcinoma in situ of vulva: Secondary | ICD-10-CM

## 2015-09-04 DIAGNOSIS — N920 Excessive and frequent menstruation with regular cycle: Secondary | ICD-10-CM | POA: Diagnosis not present

## 2015-09-04 DIAGNOSIS — D259 Leiomyoma of uterus, unspecified: Secondary | ICD-10-CM

## 2015-09-04 NOTE — Patient Instructions (Signed)
1.Total abdominal hysterectomy is scheduled for 09/30/2015. 2.  Appointment for preop will be the week before surgery.-Scheduled today. 3.  The vulvar colposcopy today demonstrated an abnormality on the right labia minora, which will be excised at the time of hysterectomy

## 2015-09-04 NOTE — Progress Notes (Signed)
Chief complaint: 1.  Uterine fibroids. 2.  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  Patient presents for colposcopy for evaluation.  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  In September 2016.  Patient is doing well without vulvar itching or burning.  No significant vaginal discharge is noted.  Patient has significant multi-fibroid uterus approximately 20 weeks size, for which she will be undergoing TAH, bilateral salpingectomy on 09/30/2015.  Multiple questions regarding surgery were answered.  Past medical history, past surgical history, problems, medications, and allergies are reviewed.  OBJECTIVE: BP 168/96 mmHg  Pulse 84  Ht 5\' 5"  (1.651 m)  Wt 211 lb 3.2 oz (95.8 kg)  BMI 35.15 kg/m2  LMP 08/21/2015 (Approximate) Female in no acute distress. Abdomen: Soft, central pelvic mass up to umbilicus is palpated consistent with multi fibroid uterus, previously diagnosed. Pelvic exam: External genitalia-right labia minora with 7 x 5 mm nodular mass at the anterior margin; remainder of vulva normal. BUS-normal. Vagina-good estrogen effect. Bimanual-2 for Rectovaginal-external hemorrhoids present; no gross lesions.  PROCEDURE: Vulvar colposcopy. Acetic acid 4 x 4's are topically applied to the introitus and vulva for 3-4 minutes. Colposcopy following removal of the acetic acid soaked gauze pads revealed no significant abnormality at the posterior fourchette/perianal region.  Only abnormality was noted on the right labia minora, anterior margin, with an acetowhite epithelial measuring 7 x 5 mm.  ASSESSMENT: 1.  Multi-fibroid uterus; TAH, bilateral salpingectomy scheduled for 09/30/2015. 2.  History of VIN 3, status post wide local excision of vulvar lesion; colposcopy today demonstrated 7 x 5 mm papule on the right labium minora, otherwise normal.  PLAN: 1.  Colposcopy is noted. 2.  Excisional biopsy of right labia minora to  be done in the operating room at the time of hysterectomy. 3.  Return for preop appointment prior to TAH, bilateral salpingectomy on 09/30/2015  .[name], MD  Note: This dictation was prepared with Dragon dictation along with smaller phrase technology. Any transcriptional errors that result from this process are unintentional.

## 2015-09-24 ENCOUNTER — Encounter: Payer: Self-pay | Admitting: *Deleted

## 2015-09-24 ENCOUNTER — Ambulatory Visit (INDEPENDENT_AMBULATORY_CARE_PROVIDER_SITE_OTHER): Payer: BLUE CROSS/BLUE SHIELD | Admitting: Obstetrics and Gynecology

## 2015-09-24 ENCOUNTER — Encounter
Admission: RE | Admit: 2015-09-24 | Discharge: 2015-09-24 | Disposition: A | Discharge status: Home / Self Care | Payer: BLUE CROSS/BLUE SHIELD | Source: Ambulatory Visit | Attending: Obstetrics and Gynecology | Admitting: Obstetrics and Gynecology

## 2015-09-24 ENCOUNTER — Encounter: Payer: Self-pay | Admitting: Obstetrics and Gynecology

## 2015-09-24 VITALS — BP 165/115 | HR 88 | Ht 65.0 in | Wt 208.8 lb

## 2015-09-24 DIAGNOSIS — N920 Excessive and frequent menstruation with regular cycle: Secondary | ICD-10-CM

## 2015-09-24 DIAGNOSIS — Z01812 Encounter for preprocedural laboratory examination: Secondary | ICD-10-CM | POA: Diagnosis not present

## 2015-09-24 DIAGNOSIS — D071 Carcinoma in situ of vulva: Secondary | ICD-10-CM

## 2015-09-24 DIAGNOSIS — D259 Leiomyoma of uterus, unspecified: Secondary | ICD-10-CM

## 2015-09-24 DIAGNOSIS — Z01818 Encounter for other preprocedural examination: Secondary | ICD-10-CM

## 2015-09-24 DIAGNOSIS — N9089 Other specified noninflammatory disorders of vulva and perineum: Secondary | ICD-10-CM

## 2015-09-24 LAB — CBC WITH DIFFERENTIAL/PLATELET
BASOS PCT: 0 %
Basophils Absolute: 0 10*3/uL (ref 0–0.1)
Eosinophils Absolute: 0.1 10*3/uL (ref 0–0.7)
Eosinophils Relative: 1 %
HCT: 39.1 % (ref 35.0–47.0)
HEMOGLOBIN: 13.1 g/dL (ref 12.0–16.0)
Lymphocytes Relative: 19 %
Lymphs Abs: 1.8 10*3/uL (ref 1.0–3.6)
MCH: 31.2 pg (ref 26.0–34.0)
MCHC: 33.6 g/dL (ref 32.0–36.0)
MCV: 93 fL (ref 80.0–100.0)
Monocytes Absolute: 0.8 10*3/uL (ref 0.2–0.9)
Monocytes Relative: 8 %
NEUTROS ABS: 6.9 10*3/uL — AB (ref 1.4–6.5)
Neutrophils Relative %: 72 %
Platelets: 308 10*3/uL (ref 150–440)
RBC: 4.2 MIL/uL (ref 3.80–5.20)
RDW: 13.5 % (ref 11.5–14.5)
WBC: 9.6 10*3/uL (ref 3.6–11.0)

## 2015-09-24 LAB — RAPID HIV SCREEN (HIV 1/2 AB+AG)
HIV 1/2 ANTIBODIES: NONREACTIVE
HIV-1 P24 Antigen - HIV24: NONREACTIVE

## 2015-09-24 LAB — TYPE AND SCREEN
ABO/RH(D): B POS
Antibody Screen: NEGATIVE

## 2015-09-24 LAB — ABO/RH: ABO/RH(D): B POS

## 2015-09-24 NOTE — Progress Notes (Signed)
Subjective:  PREOPERATIVE HISTORY AND PHYSICAL    Date of surgery:09/30/2015  Chief complaint: 1. Uterine fibroids; 20 weeks' size 2. Right labia minora lesion; history of VIN 3   Patient is a 40 y.o. G3P2053female scheduled for LAVH bilateral salpingectomy and right local excision of vulvar lesion. Indications for procedure are 20 week uterine fibroids, and right labia minora lesion.  Pertinent Gynecological History: Menarche-Uncertain Cycles-regular Menstrual flow heavy with clots Endometrial biopsy-benign History of uterine fibroids-18 weeks size History of VIN 3, status post wide local excision; right labia minora lesion 7 x 5 mm    Menstrual History: OB History    Gravida Para Term Preterm AB TAB SAB Ectopic Multiple Living   3 2 2  1     1        Patient's last menstrual period was 09/09/2015.    Past Medical History  Diagnosis Date  . Fibroids   . Depression   . Insomnia   . Vitamin D deficiency     Past Surgical History  Procedure Laterality Date  . Tubal ligation      OB History  Gravida Para Term Preterm AB SAB TAB Ectopic Multiple Living  3 2 2  1     1     # Outcome Date GA Lbr Len/2nd Weight Sex Delivery Anes PTL Lv  3 Term 2004   6 lb (2.722 kg) F Vag-Spont     2 AB 2003          1 Term 2002   7 lb (3.175 kg)  VBAC   Y      Social History   Social History  . Marital Status: Single    Spouse Name: N/A  . Number of Children: N/A  . Years of Education: N/A   Social History Main Topics  . Smoking status: Current Every Day Smoker -- 0.50 packs/day for 15 years    Types: Cigarettes    Start date: 02/01/2000  . Smokeless tobacco: Never Used  . Alcohol Use: 2.4 oz/week    4 Standard drinks or equivalent per week     Comment: wine/beer   . Drug Use: No  . Sexual Activity:    Partners: Male    Birth Control/ Protection: Other-see comments     Comment: Tubal ligation   Other Topics Concern  . None   Social History Narrative    Family  History  Problem Relation Age of Onset  . Hypertension Mother   . Hyperlipidemia Mother   . Breast cancer Maternal Grandmother   . Diabetes Maternal Grandmother   . Ovarian cancer Neg Hx   . Colon cancer Neg Hx   . Heart disease Neg Hx      (Not in a hospital admission)  No Known Allergies  Review of Systems Constitutional: No recent fever/chills/sweats Respiratory: No recent cough/bronchitis Cardiovascular: No chest pain Gastrointestinal: No recent nausea/vomiting/diarrhea Genitourinary: No UTI symptoms Hematologic/lymphatic:No history of coagulopathy or recent blood thinner use    Objective:    BP 165/115 mmHg  Pulse 88  Ht 5\' 5"  (1.651 m)  Wt 208 lb 12.8 oz (94.711 kg)  BMI 34.75 kg/m2  LMP 09/09/2015  General:   Normal  Skin:   normal  HEENT:  Normal  Neck:  Supple without Adenopathy or Thyromegaly  Lungs:   Heart:              Breasts:   Abdomen:  Pelvis:  M/S   Extremeties:  Neuro:    clear to  auscultation bilaterally   Normal without murmur   Not Examined   soft, non-tender; bowel sounds normal; no masses,  no organomegaly   uterus 18-20 weeks' size; 5 x 7 mm right labia minora lesion  No CVAT  Warm/Dry   Normal          Assessment:      1. Fibroid uterus, 18-20 weeks size 2. Right labia minora lesion 7 x 5 mm; history of CIN-3 status post wide local excision   Plan:    1. TAH bilateral salpingectomy through a midline incision 2. Wide local excision of right labia minora lesion  PREOPERATIVE counseling: The patient is to undergo total abdominal hysterectomy bilateral salpingectomy through a midline incision for 18-20 week size fibroid uterus. She is experiencing heavy bleeding uncontrolled with medication. Patient also has 7 x 5 mm right labia minora lesion which will require wide local excision due to history of VIN 3. The patient is understanding of the planned procedures and is aware of and is accepting of all surgical risks which include  but are not limited to bleeding, infection, pelvic organ injury needing repair, blood clot disorders, anesthesia risks, etc. All questions have been answered. Informed consent is given. Patient is ready and willing to proceed with surgery as scheduled.  [name], MD   Note: This dictation was prepared with Dragon dictation along with smaller phrase technology. Any transcriptional errors that result from this process are unintentional.

## 2015-09-24 NOTE — H&P (Signed)
Subjective:  PREOPERATIVE HISTORY AND PHYSICAL    Date of surgery:09/30/2015  Chief complaint: 1. Uterine fibroids; 20 weeks' size 2. Right labia minora lesion; history of VIN 3   Patient is a 40 y.o. G3P205female scheduled for LAVH bilateral salpingectomy and right local excision of vulvar lesion. Indications for procedure are 20 week uterine fibroids, and right labia minora lesion.  Pertinent Gynecological History: Menarche-Uncertain Cycles-regular Menstrual flow heavy with clots Endometrial biopsy-benign History of uterine fibroids-18 weeks size History of VIN 3, status post wide local excision; right labia minora lesion 7 x 5 mm    Menstrual History: OB History    Gravida Para Term Preterm AB TAB SAB Ectopic Multiple Living   3 2 2  1     1        Patient's last menstrual period was 09/09/2015.    Past Medical History  Diagnosis Date  . Fibroids   . Depression   . Insomnia   . Vitamin D deficiency     Past Surgical History  Procedure Laterality Date  . Tubal ligation      OB History  Gravida Para Term Preterm AB SAB TAB Ectopic Multiple Living  3 2 2  1     1     # Outcome Date GA Lbr Len/2nd Weight Sex Delivery Anes PTL Lv  3 Term 2004   6 lb (2.722 kg) F Vag-Spont     2 AB 2003          1 Term 2002   7 lb (3.175 kg)  VBAC   Y      Social History   Social History  . Marital Status: Single    Spouse Name: N/A  . Number of Children: N/A  . Years of Education: N/A   Social History Main Topics  . Smoking status: Current Every Day Smoker -- 0.50 packs/day for 15 years    Types: Cigarettes    Start date: 02/01/2000  . Smokeless tobacco: Never Used  . Alcohol Use: 2.4 oz/week    4 Standard drinks or equivalent per week     Comment: wine/beer   . Drug Use: No  . Sexual Activity:    Partners: Male    Birth Control/ Protection: Other-see comments     Comment: Tubal ligation   Other Topics Concern  . None   Social History Narrative    Family  History  Problem Relation Age of Onset  . Hypertension Mother   . Hyperlipidemia Mother   . Breast cancer Maternal Grandmother   . Diabetes Maternal Grandmother   . Ovarian cancer Neg Hx   . Colon cancer Neg Hx   . Heart disease Neg Hx      (Not in a hospital admission)  No Known Allergies  Review of Systems Constitutional: No recent fever/chills/sweats Respiratory: No recent cough/bronchitis Cardiovascular: No chest pain Gastrointestinal: No recent nausea/vomiting/diarrhea Genitourinary: No UTI symptoms Hematologic/lymphatic:No history of coagulopathy or recent blood thinner use    Objective:    BP 165/115 mmHg  Pulse 88  Ht 5\' 5"  (1.651 m)  Wt 208 lb 12.8 oz (94.711 kg)  BMI 34.75 kg/m2  LMP 09/09/2015  General:   Normal  Skin:   normal  HEENT:  Normal  Neck:  Supple without Adenopathy or Thyromegaly  Lungs:   Heart:              Breasts:   Abdomen:  Pelvis:  M/S   Extremeties:  Neuro:    clear to  auscultation bilaterally   Normal without murmur   Not Examined   soft, non-tender; bowel sounds normal; no masses,  no organomegaly   uterus 18-20 weeks' size; 5 x 7 mm right labia minora lesion  No CVAT  Warm/Dry   Normal          Assessment:      1. Fibroid uterus, 18-20 weeks size 2. Right labia minora lesion 7 x 5 mm; history of CIN-3 status post wide local excision   Plan:    1. TAH bilateral salpingectomy through a midline incision 2. Wide local excision of right labia minora lesion  PREOPERATIVE counseling: The patient is to undergo total abdominal hysterectomy bilateral salpingectomy through a midline incision for 18-20 week size fibroid uterus. She is experiencing heavy bleeding uncontrolled with medication. Patient also has 7 x 5 mm right labia minora lesion which will require wide local excision due to history of VIN 3. The patient is understanding of the planned procedures and is aware of and is accepting of all surgical risks which include  but are not limited to bleeding, infection, pelvic organ injury needing repair, blood clot disorders, anesthesia risks, etc. All questions have been answered. Informed consent is given. Patient is ready and willing to proceed with surgery as scheduled.  [name], MD   Note: This dictation was prepared with Dragon dictation along with smaller phrase technology. Any transcriptional errors that result from this process are unintentional.

## 2015-09-24 NOTE — Patient Instructions (Signed)
  Your procedure is scheduled on: 09/30/15 Report to Day Surgery. To find out your arrival time please call [phone] between 1PM - 3PM on 09/27/15  Remember: Instructions that are not followed completely may result in serious medical risk, up to and including death, or upon the discretion of your surgeon and anesthesiologist your surgery may need to be rescheduled.    __x__ 1. Do not eat food or drink liquids after midnight. No gum chewing or hard candies.     __x_ 2. No Alcohol for 24 hours before or after surgery.   ____ 3. Bring all medications with you on the day of surgery if instructed.    __x__ 4. Notify your doctor if there is any change in your medical condition     (cold, fever, infections).     Do not wear jewelry, make-up, hairpins, clips or nail polish.  Do not wear lotions, powders, or perfumes. You may wear deodorant.  Do not shave 48 hours prior to surgery. Men may shave face and neck.  Do not bring valuables to the hospital.    [facility] is not responsible for any belongings or valuables.               Contacts, dentures or bridgework may not be worn into surgery.  Leave your suitcase in the car. After surgery it may be brought to your room.  For patients admitted to the hospital, discharge time is determined by your                treatment team.   Patients discharged the day of surgery will not be allowed to drive home.   Please read over the following fact sheets that you were given:   Surgical Site Infection Prevention   ____ Take these medicines the morning of surgery with A SIP OF WATER:    1. wellbutrin  2. celexa  3.   4.  5.  6.  ____ Fleet Enema (as directed)   __x__ Use CHG Soap as directed  ____ Use inhalers on the day of surgery  ____ Stop metformin 2 days prior to surgery    ____ Take 1/2 of usual insulin dose the night before surgery and none on the morning of surgery.   ____ Stop Coumadin/Plavix/aspirin on   _x___ Stop  Anti-inflammatories only tylenol until surgery   ____ Stop supplements until after surgery.    ____ Bring C-Pap to the hospital.

## 2015-09-24 NOTE — Patient Instructions (Signed)
1. Return in 1 week following surgery for postop check 2. Take out all piercings prior to surgery

## 2015-09-25 LAB — RPR: RPR Ser Ql: NONREACTIVE

## 2015-09-30 ENCOUNTER — Encounter: Payer: Self-pay | Admitting: Anesthesiology

## 2015-09-30 ENCOUNTER — Encounter
Admission: RE | Disposition: A | Discharge status: Home / Self Care | Payer: Self-pay | Source: Ambulatory Visit | Attending: Obstetrics and Gynecology

## 2015-09-30 ENCOUNTER — Ambulatory Visit: Payer: BLUE CROSS/BLUE SHIELD | Admitting: Anesthesiology

## 2015-09-30 ENCOUNTER — Inpatient Hospital Stay
Admission: RE | Admit: 2015-09-30 | Discharge: 2015-10-02 | DRG: 743 | Disposition: A | Discharge status: Home / Self Care | Payer: BLUE CROSS/BLUE SHIELD | Source: Ambulatory Visit | Attending: Obstetrics and Gynecology | Admitting: Obstetrics and Gynecology

## 2015-09-30 DIAGNOSIS — D259 Leiomyoma of uterus, unspecified: Principal | ICD-10-CM | POA: Diagnosis present

## 2015-09-30 DIAGNOSIS — D071 Carcinoma in situ of vulva: Secondary | ICD-10-CM

## 2015-09-30 DIAGNOSIS — N9089 Other specified noninflammatory disorders of vulva and perineum: Secondary | ICD-10-CM

## 2015-09-30 DIAGNOSIS — N920 Excessive and frequent menstruation with regular cycle: Secondary | ICD-10-CM | POA: Diagnosis not present

## 2015-09-30 DIAGNOSIS — Z90710 Acquired absence of both cervix and uterus: Secondary | ICD-10-CM | POA: Diagnosis not present

## 2015-09-30 DIAGNOSIS — Z833 Family history of diabetes mellitus: Secondary | ICD-10-CM | POA: Diagnosis not present

## 2015-09-30 DIAGNOSIS — N949 Unspecified condition associated with female genital organs and menstrual cycle: Secondary | ICD-10-CM | POA: Diagnosis not present

## 2015-09-30 DIAGNOSIS — N831 Corpus luteum cyst of ovary, unspecified side: Secondary | ICD-10-CM | POA: Diagnosis present

## 2015-09-30 DIAGNOSIS — Z803 Family history of malignant neoplasm of breast: Secondary | ICD-10-CM

## 2015-09-30 DIAGNOSIS — Z9851 Tubal ligation status: Secondary | ICD-10-CM

## 2015-09-30 DIAGNOSIS — Z8249 Family history of ischemic heart disease and other diseases of the circulatory system: Secondary | ICD-10-CM | POA: Diagnosis not present

## 2015-09-30 DIAGNOSIS — R238 Other skin changes: Secondary | ICD-10-CM | POA: Diagnosis present

## 2015-09-30 DIAGNOSIS — F17210 Nicotine dependence, cigarettes, uncomplicated: Secondary | ICD-10-CM | POA: Diagnosis present

## 2015-09-30 LAB — POCT PREGNANCY, URINE: Preg Test, Ur: NEGATIVE

## 2015-09-30 SURGERY — HYSTERECTOMY, TOTAL, ABDOMINAL, WITH SALPINGECTOMY
Anesthesia: General | Laterality: Bilateral | Wound class: Clean Contaminated

## 2015-09-30 MED ORDER — ONDANSETRON HCL 4 MG/2ML IJ SOLN: 4.0000 mg | Freq: Once | INTRAMUSCULAR | Status: DC | PRN

## 2015-09-30 MED ORDER — ACETAMINOPHEN 325 MG PO TABS: 650.0000 mg | ORAL_TABLET | ORAL | Status: DC | PRN

## 2015-09-30 MED ORDER — MIDAZOLAM HCL 2 MG/2ML IJ SOLN
INTRAMUSCULAR | Status: DC | PRN
  Administered 2015-09-30: 2 mg via INTRAVENOUS

## 2015-09-30 MED ORDER — ROCURONIUM BROMIDE 100 MG/10ML IV SOLN
INTRAVENOUS | Status: DC | PRN
  Administered 2015-09-30: 30 mg via INTRAVENOUS
  Administered 2015-09-30 (×2): 20 mg via INTRAVENOUS

## 2015-09-30 MED ORDER — FAMOTIDINE 20 MG PO TABS
ORAL_TABLET | ORAL | Status: AC
  Filled 2015-09-30: qty 1

## 2015-09-30 MED ORDER — FENTANYL CITRATE (PF) 100 MCG/2ML IJ SOLN
25.0000 ug | INTRAMUSCULAR | Status: AC | PRN
  Administered 2015-09-30 (×6): 25 ug via INTRAVENOUS

## 2015-09-30 MED ORDER — KETOROLAC TROMETHAMINE 30 MG/ML IJ SOLN
30.0000 mg | Freq: Four times a day (QID) | INTRAMUSCULAR | Status: DC
  Administered 2015-09-30: 30 mg via INTRAVENOUS

## 2015-09-30 MED ORDER — ONDANSETRON HCL 4 MG/2ML IJ SOLN
INTRAMUSCULAR | Status: DC | PRN
  Administered 2015-09-30: 4 mg via INTRAVENOUS

## 2015-09-30 MED ORDER — PROPOFOL 10 MG/ML IV BOLUS
INTRAVENOUS | Status: DC | PRN
  Administered 2015-09-30: 150 mg via INTRAVENOUS

## 2015-09-30 MED ORDER — DOCUSATE SODIUM 100 MG PO CAPS
100.0000 mg | ORAL_CAPSULE | Freq: Two times a day (BID) | ORAL | Status: DC
Start: 2015-09-30 — End: 2015-10-02
  Administered 2015-09-30 – 2015-10-02 (×4): 100 mg via ORAL
  Filled 2015-09-30 (×4): qty 1

## 2015-09-30 MED ORDER — LIDOCAINE HCL (CARDIAC) 20 MG/ML IV SOLN
INTRAVENOUS | Status: DC | PRN
  Administered 2015-09-30: 80 mg via INTRAVENOUS

## 2015-09-30 MED ORDER — GLYCOPYRROLATE 0.2 MG/ML IJ SOLN
INTRAMUSCULAR | Status: DC | PRN
  Administered 2015-09-30: 0.2 mg via INTRAVENOUS

## 2015-09-30 MED ORDER — KETOROLAC TROMETHAMINE 30 MG/ML IJ SOLN: 30.0000 mg | Freq: Four times a day (QID) | INTRAMUSCULAR | Status: DC

## 2015-09-30 MED ORDER — MORPHINE SULFATE (PF) 2 MG/ML IV SOLN
INTRAVENOUS | Status: AC
  Administered 2015-09-30: 2 mg via INTRAVENOUS
  Filled 2015-09-30: qty 1

## 2015-09-30 MED ORDER — SUGAMMADEX SODIUM 200 MG/2ML IV SOLN
INTRAVENOUS | Status: DC | PRN
  Administered 2015-09-30: 190 mg via INTRAVENOUS

## 2015-09-30 MED ORDER — CEFAZOLIN SODIUM-DEXTROSE 2-3 GM-% IV SOLR
2.0000 g | Freq: Once | INTRAVENOUS | Status: AC
  Administered 2015-09-30: 2 g via INTRAVENOUS

## 2015-09-30 MED ORDER — OXYCODONE-ACETAMINOPHEN 5-325 MG PO TABS
1.0000 | ORAL_TABLET | ORAL | Status: DC | PRN
  Administered 2015-09-30 – 2015-10-01 (×6): 2 via ORAL
  Administered 2015-10-02 (×2): 1 via ORAL
  Administered 2015-10-02 (×2): 2 via ORAL
  Filled 2015-09-30: qty 1
  Filled 2015-09-30 (×7): qty 2
  Filled 2015-09-30: qty 1
  Filled 2015-09-30: qty 2

## 2015-09-30 MED ORDER — FENTANYL CITRATE (PF) 100 MCG/2ML IJ SOLN
INTRAMUSCULAR | Status: AC
  Filled 2015-09-30: qty 2

## 2015-09-30 MED ORDER — LIDOCAINE 5 % EX PTCH
1.0000 | MEDICATED_PATCH | CUTANEOUS | Status: DC
  Administered 2015-10-01 – 2015-10-02 (×2): 1 via TRANSDERMAL
  Filled 2015-09-30 (×2): qty 1

## 2015-09-30 MED ORDER — MORPHINE SULFATE (PF) 2 MG/ML IV SOLN
1.0000 mg | INTRAVENOUS | Status: DC | PRN
  Administered 2015-09-30: 2 mg via INTRAVENOUS

## 2015-09-30 MED ORDER — DEXAMETHASONE SODIUM PHOSPHATE 10 MG/ML IJ SOLN
INTRAMUSCULAR | Status: DC | PRN
  Administered 2015-09-30: 10 mg via INTRAVENOUS

## 2015-09-30 MED ORDER — SIMETHICONE 80 MG PO CHEW
80.0000 mg | CHEWABLE_TABLET | Freq: Four times a day (QID) | ORAL | Status: DC | PRN
  Administered 2015-10-02: 80 mg via ORAL
  Filled 2015-09-30: qty 1

## 2015-09-30 MED ORDER — FENTANYL CITRATE (PF) 100 MCG/2ML IJ SOLN
INTRAMUSCULAR | Status: DC | PRN
  Administered 2015-09-30 (×4): 50 ug via INTRAVENOUS

## 2015-09-30 MED ORDER — LIDOCAINE 5 % EX PTCH
MEDICATED_PATCH | CUTANEOUS | Status: DC | PRN
  Administered 2015-09-30: 1 via TRANSDERMAL

## 2015-09-30 MED ORDER — LACTATED RINGERS IV SOLN
INTRAVENOUS | Status: DC
  Administered 2015-09-30 (×2): via INTRAVENOUS

## 2015-09-30 MED ORDER — KETOROLAC TROMETHAMINE 30 MG/ML IJ SOLN
INTRAMUSCULAR | Status: AC
  Filled 2015-09-30: qty 1

## 2015-09-30 MED ORDER — FAMOTIDINE 20 MG PO TABS
20.0000 mg | ORAL_TABLET | Freq: Once | ORAL | Status: AC
  Administered 2015-09-30: 20 mg via ORAL

## 2015-09-30 MED ORDER — CEFAZOLIN SODIUM-DEXTROSE 2-3 GM-% IV SOLR
INTRAVENOUS | Status: AC
  Administered 2015-09-30: 2 g via INTRAVENOUS
  Filled 2015-09-30: qty 50

## 2015-09-30 MED ORDER — LACTATED RINGERS IV SOLN
INTRAVENOUS | Status: DC
  Administered 2015-09-30 (×2): via INTRAVENOUS

## 2015-09-30 MED ORDER — LIDOCAINE 5 % EX PTCH
MEDICATED_PATCH | CUTANEOUS | Status: AC
  Filled 2015-09-30: qty 1

## 2015-09-30 MED ORDER — BISACODYL 10 MG RE SUPP: 10.0000 mg | Freq: Every day | RECTAL | Status: DC | PRN

## 2015-09-30 MED ORDER — KETOROLAC TROMETHAMINE 30 MG/ML IJ SOLN
30.0000 mg | Freq: Four times a day (QID) | INTRAMUSCULAR | Status: DC
  Administered 2015-09-30 – 2015-10-01 (×3): 30 mg via INTRAVENOUS
  Filled 2015-09-30 (×3): qty 1

## 2015-09-30 SURGICAL SUPPLY — 37 items
BAG BILE T-TUBES STRL (MISCELLANEOUS) IMPLANT
CANISTER SUCT 1200ML W/VALVE (MISCELLANEOUS) ×3 IMPLANT
CATH TRAY 16F METER LATEX (MISCELLANEOUS) ×3 IMPLANT
CHLORAPREP W/TINT 26ML (MISCELLANEOUS) ×3 IMPLANT
DRAPE LAPAROTOMY 100X77 ABD (DRAPES) IMPLANT
DRAPE LAPAROTOMY TRNSV 106X77 (MISCELLANEOUS) IMPLANT
DRSG TELFA 3X8 NADH (GAUZE/BANDAGES/DRESSINGS) ×3 IMPLANT
ELECT BLADE 6 FLAT ULTRCLN (ELECTRODE) ×3 IMPLANT
ELECT BLADE 6.5 EXT (BLADE) ×3 IMPLANT
ELECT CAUTERY BLADE 6.4 (BLADE) ×3 IMPLANT
ELECT REM PT RETURN 9FT ADLT (ELECTROSURGICAL) ×3
ELECTRODE REM PT RTRN 9FT ADLT (ELECTROSURGICAL) ×1 IMPLANT
GAUZE SPONGE 4X4 12PLY STRL (GAUZE/BANDAGES/DRESSINGS) ×3 IMPLANT
GLOVE BIO SURGEON STRL SZ 6 (GLOVE) ×3 IMPLANT
GLOVE INDICATOR 8.0 STRL GRN (GLOVE) ×3 IMPLANT
GOWN STRL REUS W/ TWL LRG LVL3 (GOWN DISPOSABLE) ×2 IMPLANT
GOWN STRL REUS W/ TWL XL LVL3 (GOWN DISPOSABLE) ×1 IMPLANT
GOWN STRL REUS W/TWL LRG LVL3 (GOWN DISPOSABLE) ×4
GOWN STRL REUS W/TWL XL LVL3 (GOWN DISPOSABLE) ×2
KIT RM TURNOVER STRD PROC AR (KITS) ×3 IMPLANT
LABEL OR SOLS (LABEL) ×3 IMPLANT
LIGASURE IMPACT 36 18CM CVD LR (INSTRUMENTS) ×3 IMPLANT
PACK BASIN MAJOR ARMC (MISCELLANEOUS) ×3 IMPLANT
STAPLER SKIN PROX 35W (STAPLE) ×3 IMPLANT
SUT CHROMIC 0 CT 1 (SUTURE) ×6 IMPLANT
SUT MAXON ABS #0 GS21 30IN (SUTURE) ×9 IMPLANT
SUT SILK 0 (SUTURE) ×4
SUT SILK 0 30XBRD TIE 6 (SUTURE) ×2 IMPLANT
SUT VIC AB 0 CT1 27 (SUTURE) ×6
SUT VIC AB 0 CT1 27XCR 8 STRN (SUTURE) ×3 IMPLANT
SUT VIC AB 2-0 CT1 36 (SUTURE) ×3 IMPLANT
SUT VIC AB 3-0 SH 27 (SUTURE) ×2
SUT VIC AB 3-0 SH 27X BRD (SUTURE) ×1 IMPLANT
TRAY FOLEY W/METER SILVER 16FR (SET/KITS/TRAYS/PACK) ×3 IMPLANT
TRAY PREP VAG/GEN (MISCELLANEOUS) ×3 IMPLANT
TUBING ART PRESS 48 MALE/FEM (TUBING) IMPLANT
WATER STERILE IRR 1000ML POUR (IV SOLUTION) ×6 IMPLANT

## 2015-09-30 NOTE — Anesthesia Procedure Notes (Signed)
Procedure Name: Intubation Date/Time: 09/30/2015 1:59 PM Performed by: Silvana Newness Pre-anesthesia Checklist: Patient identified, Emergency Drugs available, Suction available, Patient being monitored and Timeout performed Patient Re-evaluated:Patient Re-evaluated prior to inductionOxygen Delivery Method: Circle system utilized Preoxygenation: Pre-oxygenation with 100% oxygen Intubation Type: IV induction Ventilation: Mask ventilation without difficulty Laryngoscope Size: Mac and 3 Grade View: Grade I Tube type: Oral Tube size: 7.0 mm Number of attempts: 1 Airway Equipment and Method: Rigid stylet Placement Confirmation: ETT inserted through vocal cords under direct vision,  positive ETCO2 and breath sounds checked- equal and bilateral Secured at: 20 cm Tube secured with: Tape Dental Injury: Teeth and Oropharynx as per pre-operative assessment

## 2015-09-30 NOTE — Transfer of Care (Signed)
Immediate Anesthesia Transfer of Care Note  Patient: Theresa Sandoval  Procedure(s) Performed: Procedure(s): HYSTERECTOMY ABDOMINAL WITH BILATERAL SALPINGECTOMY (Bilateral)  Patient Location: PACU  Anesthesia Type:General  Level of Consciousness: awake, alert , oriented and patient cooperative  Airway & Oxygen Therapy: Patient Spontanous Breathing and Patient connected to face mask oxygen  Post-op Assessment: Report given to RN, Post -op Vital signs reviewed and stable and Patient moving all extremities X 4  Post vital signs: Reviewed and stable  Last Vitals:  Filed Vitals:   09/24/15 1152 09/30/15 1229  BP: 152/105 173/107  Pulse: 78 75  Temp:  36.7 C  Resp: 16 18    Complications: No apparent anesthesia complications

## 2015-09-30 NOTE — Interval H&P Note (Signed)
History and Physical Interval Note:  09/30/2015 1:34 PM  Theresa Sandoval  has presented today for surgery, with the diagnosis of SEVERE VULVAR DYSPLASIA, MENORRHAGIA, UTERINE LEIOMYOMA, VULVAR LESION  The various methods of treatment have been discussed with the patient and family. After consideration of risks, benefits and other options for treatment, the patient has consented to  Procedure(s): HYSTERECTOMY ABDOMINAL WITH BILATERAL SALPINGECTOMY (Bilateral) as a surgical intervention .  The patient's history has been reviewed, patient examined, no change in status, stable for surgery.  I have reviewed the patient's chart and labs.  Questions were answered to the patient's satisfaction.     Hassell Done A Juliann Olesky

## 2015-09-30 NOTE — H&P (View-Only) (Signed)
Subjective:  PREOPERATIVE HISTORY AND PHYSICAL    Date of surgery:09/30/2015  Chief complaint: 1. Uterine fibroids; 20 weeks' size 2. Right labia minora lesion; history of VIN 3   Patient is a 40 y.o. G3P2051female scheduled for LAVH bilateral salpingectomy and right local excision of vulvar lesion. Indications for procedure are 20 week uterine fibroids, and right labia minora lesion.  Pertinent Gynecological History: Menarche-Uncertain Cycles-regular Menstrual flow heavy with clots Endometrial biopsy-benign History of uterine fibroids-18 weeks size History of VIN 3, status post wide local excision; right labia minora lesion 7 x 5 mm    Menstrual History: OB History    Gravida Para Term Preterm AB TAB SAB Ectopic Multiple Living   3 2 2  1     1        Patient's last menstrual period was 09/09/2015.    Past Medical History  Diagnosis Date  . Fibroids   . Depression   . Insomnia   . Vitamin D deficiency     Past Surgical History  Procedure Laterality Date  . Tubal ligation      OB History  Gravida Para Term Preterm AB SAB TAB Ectopic Multiple Living  3 2 2  1     1     # Outcome Date GA Lbr Len/2nd Weight Sex Delivery Anes PTL Lv  3 Term 2004   6 lb (2.722 kg) F Vag-Spont     2 AB 2003          1 Term 2002   7 lb (3.175 kg)  VBAC   Y      Social History   Social History  . Marital Status: Single    Spouse Name: N/A  . Number of Children: N/A  . Years of Education: N/A   Social History Main Topics  . Smoking status: Current Every Day Smoker -- 0.50 packs/day for 15 years    Types: Cigarettes    Start date: 02/01/2000  . Smokeless tobacco: Never Used  . Alcohol Use: 2.4 oz/week    4 Standard drinks or equivalent per week     Comment: wine/beer   . Drug Use: No  . Sexual Activity:    Partners: Male    Birth Control/ Protection: Other-see comments     Comment: Tubal ligation   Other Topics Concern  . None   Social History Narrative    Family  History  Problem Relation Age of Onset  . Hypertension Mother   . Hyperlipidemia Mother   . Breast cancer Maternal Grandmother   . Diabetes Maternal Grandmother   . Ovarian cancer Neg Hx   . Colon cancer Neg Hx   . Heart disease Neg Hx      (Not in a hospital admission)  No Known Allergies  Review of Systems Constitutional: No recent fever/chills/sweats Respiratory: No recent cough/bronchitis Cardiovascular: No chest pain Gastrointestinal: No recent nausea/vomiting/diarrhea Genitourinary: No UTI symptoms Hematologic/lymphatic:No history of coagulopathy or recent blood thinner use    Objective:    BP 165/115 mmHg  Pulse 88  Ht 5\' 5"  (1.651 m)  Wt 208 lb 12.8 oz (94.711 kg)  BMI 34.75 kg/m2  LMP 09/09/2015  General:   Normal  Skin:   normal  HEENT:  Normal  Neck:  Supple without Adenopathy or Thyromegaly  Lungs:   Heart:              Breasts:   Abdomen:  Pelvis:  M/S   Extremeties:  Neuro:    clear to  auscultation bilaterally   Normal without murmur   Not Examined   soft, non-tender; bowel sounds normal; no masses,  no organomegaly   uterus 18-20 weeks' size; 5 x 7 mm right labia minora lesion  No CVAT  Warm/Dry   Normal          Assessment:      1. Fibroid uterus, 18-20 weeks size 2. Right labia minora lesion 7 x 5 mm; history of CIN-3 status post wide local excision   Plan:    1. TAH bilateral salpingectomy through a midline incision 2. Wide local excision of right labia minora lesion  PREOPERATIVE counseling: The patient is to undergo total abdominal hysterectomy bilateral salpingectomy through a midline incision for 18-20 week size fibroid uterus. She is experiencing heavy bleeding uncontrolled with medication. Patient also has 7 x 5 mm right labia minora lesion which will require wide local excision due to history of VIN 3. The patient is understanding of the planned procedures and is aware of and is accepting of all surgical risks which include  but are not limited to bleeding, infection, pelvic organ injury needing repair, blood clot disorders, anesthesia risks, etc. All questions have been answered. Informed consent is given. Patient is ready and willing to proceed with surgery as scheduled.  [name], MD   Note: This dictation was prepared with Dragon dictation along with smaller phrase technology. Any transcriptional errors that result from this process are unintentional.

## 2015-09-30 NOTE — Anesthesia Preprocedure Evaluation (Signed)
Anesthesia Evaluation  Patient identified by MRN, date of birth, ID band Patient awake    Reviewed: Allergy & Precautions, NPO status , Patient's Chart, lab work & pertinent test results, reviewed documented beta blocker date and time   Airway Mallampati: III  TM Distance: >3 FB     Dental  (+) Chipped   Pulmonary Current Smoker,           Cardiovascular      Neuro/Psych PSYCHIATRIC DISORDERS Depression    GI/Hepatic   Endo/Other    Renal/GU      Musculoskeletal   Abdominal   Peds  Hematology   Anesthesia Other Findings Obese. Hypertensive.  Reproductive/Obstetrics                             Anesthesia Physical Anesthesia Plan  ASA: III  Anesthesia Plan: General   Post-op Pain Management:    Induction: Intravenous  Airway Management Planned: Oral ETT  Additional Equipment:   Intra-op Plan:   Post-operative Plan:   Informed Consent: I have reviewed the patients History and Physical, chart, labs and discussed the procedure including the risks, benefits and alternatives for the proposed anesthesia with the patient or authorized representative who has indicated his/her understanding and acceptance.     Plan Discussed with: CRNA  Anesthesia Plan Comments:         Anesthesia Quick Evaluation

## 2015-09-30 NOTE — Anesthesia Postprocedure Evaluation (Signed)
Anesthesia Post Note  Patient: Theresa Sandoval  Procedure(s) Performed: Procedure(s) (LRB): HYSTERECTOMY ABDOMINAL WITH BILATERAL SALPINGECTOMY (Bilateral)  Patient location during evaluation: PACU Anesthesia Type: General Level of consciousness: awake and alert Pain management: pain level controlled Vital Signs Assessment: post-procedure vital signs reviewed and stable Respiratory status: spontaneous breathing, nonlabored ventilation, respiratory function stable and patient connected to nasal cannula oxygen Cardiovascular status: blood pressure returned to baseline and stable Postop Assessment: no signs of nausea or vomiting Anesthetic complications: no    Last Vitals:  Filed Vitals:   09/30/15 1720 09/30/15 2000  BP: 127/75 150/93  Pulse: 80 68  Temp: 36.8 C 36.8 C  Resp: 16 16    Last Pain:  Filed Vitals:   09/30/15 2048  PainSc: [place]

## 2015-09-30 NOTE — Op Note (Signed)
OPERATIVE NOTE:  Theresa Sandoval PROCEDURE DATE: 09/30/2015   PREOPERATIVE DIAGNOSIS:  1. Leiomyomata uteri 2. Vulvar lesion POSTOPERATIVE DIAGNOSIS:  1. Leiomyoma uteri 2. Vulvar lesion PROCEDURE:  1. Wide local excision of vulvar lesion - right labia minora 2. TAH bilateral salpingectomy  SURGEON:  [name], MD ASSISTANTS: Dr. Marcelline Mates ANESTHESIA: General INDICATIONS: 40 y.o. 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  FINDINGS:   1. 20 week size fibroid uterus; normal tubes and ovaries 2. Right ovarian corpus luteum cyst 3. 5 x 7 mm papule right labia minora   I/O's: Total I/O In: 1300 [I.V.:1300] Out: 200 [Blood:200] COUNTS:  YES SPECIMENS: Uterus, cervix, bilateral fallopian tubes, right labia minora biopsy ANTIBIOTIC PROPHYLAXIS:Ancef 2 grams COMPLICATIONS: None immediate  PROCEDURE IN DETAIL:  Patient was brought to the operating room where she was placed in the supine position general endotracheal anesthesia was induced without difficulty. She was placed in the dorsal lithotomy position using the bumblebee stirrups. A ChloraPrep and Betadine abdominal perineal intravaginal prep and drape was performed in standard fashion. A Foley catheter was placed and was draining clear yellow urine.  Right labia minora wide local excision: The lesion on the right labia minora, a 5 x 7 mm papule was grasped with an Allis clamp. Sharp dissection was made around the lesion for excision. Bovie cautery was utilized to help with hemostasis. A simple running stitch of 3-0 Vicryl was used to provide optimal hemostasis. Once this excisional biopsy was complete, the patient was placed in the low lithotomy position and preparation for abdominal hysterectomy ensued.  Total abdominal hysterectomy was performed in standard fashion. Incision from just beneath  the umbilicus to within 4 cm of the symphysis pubis was made with the scalpel. Bovie cautery was then used to take down the subcutaneous tissues to the fascia. The fascia was incised in the midline and extended superiorly and inferiorly with Mayo scissors. Peritoneum was entered. The abdomen was explored and the above noted findings were appreciated. The uterus was then delivered through the abdominal incision. Uterus was noted to be approximately 20 weeks size with multiple fibroids present. Tubes and ovaries were grossly normal. Hysterectomy was then performed in standard fashion using the LigaSure impact instrument remained. The round ligament was taken down followed by the utero-ovarian ligaments. This was followed by sequential clamping coagulating and cutting the cardinal broad ligament complexes. This was carried up bilaterally down to the level of the lower uterine segment. At this point the bladder flap was created through sharp dissection. The bladder was mobilized off of the lower uterine segment and out of harm's way. Once this was accomplished a supracervical procedure was performed with amputation of the uterine fundus from the cervix. This was handed off for pathologic specimen. Next the cervical stump was grasped with a double-tooth tenaculum. Bladder was again mobilized off of the lower cervix and sequentially the remainder of the cardinal broad ligament complexes were taken down using the straight Heaney clamps followed by cutting with Mayo's scissors and stick ties using 0 Vicryl suture. At the level of the cervicovaginal junction the tissue was crossclamped with curved Heaney clamps and the cervix was excised from the operative field. The angles of the vagina were tied off using the Richardson technique using 0 Vicryl suture. The intervening vaginal mucosa was then reapproximated using multiple figure-of-eight sutures. Good  hemostasis was obtained. Copious irrigation of the pelvis was then  performed. Hemostasis was optimized with the aid of Bovie cautery and. The infindulopelvic ligaments/ovarian pedicles were hemostatic. The vaginal cuff was hemostatic. Once satisfied, all instrumentationwas removed from the abdominal pelvic cavity and the abdomen was closed in layers using 0 Maxon on the fascia in a simple running manner. The subcutaneous tissues were reapproximated with 2-0 Vicryl suture in a simple running manner. The skin was closed with staples. Lidoderm patch for pain relief was applied, followed by Telfa pressure dressing. Patient was then awakened extubated and taken to recovery room in satisfactory condition. All needles and sponge counts were verified as correct.  Theresa Sandoval A. Theresa Plants, MD, ACOG [facility]

## 2015-10-01 LAB — HEMOGLOBIN: HEMOGLOBIN: 10.3 g/dL — AB (ref 12.0–16.0)

## 2015-10-01 MED ORDER — IBUPROFEN 800 MG PO TABS
800.0000 mg | ORAL_TABLET | Freq: Three times a day (TID) | ORAL | Status: DC
  Administered 2015-10-01 – 2015-10-02 (×4): 800 mg via ORAL
  Filled 2015-10-01 (×4): qty 1

## 2015-10-01 NOTE — Progress Notes (Signed)
1 Day Post-Op Procedure(s) (LRB): HYSTERECTOMY ABDOMINAL WITH BILATERAL SALPINGECTOMY (Bilateral)  Subjective: Patient reports tolerating PO, + flatus and no problems voiding.    Objective: I have reviewed patient's vital signs, intake and output, medications and labs.  General: alert and cooperative GI: normal findings: soft, non-tender and Incision wnl Extremities: no edema, redness or tenderness in the calves or thighs Vaginal Bleeding: none  Assessment: s/p Procedure(s): HYSTERECTOMY ABDOMINAL WITH BILATERAL SALPINGECTOMY (Bilateral): stable, progressing well and tolerating diet  Plan: Advance diet Encourage ambulation Advance to PO medication  LOS: 1 day    Theresa Sandoval 10/01/2015, 1:46 PM

## 2015-10-02 LAB — SURGICAL PATHOLOGY

## 2015-10-02 MED ORDER — DOCUSATE SODIUM 100 MG PO CAPS: 100.0000 mg | ORAL_CAPSULE | Freq: Two times a day (BID) | ORAL | Status: DC

## 2015-10-02 MED ORDER — OXYCODONE-ACETAMINOPHEN 5-325 MG PO TABS: 1.0000 | ORAL_TABLET | ORAL | Status: DC | PRN

## 2015-10-02 MED ORDER — IBUPROFEN 800 MG PO TABS: 800.0000 mg | ORAL_TABLET | Freq: Three times a day (TID) | ORAL | Status: DC

## 2015-10-02 NOTE — Progress Notes (Signed)
Pt discharged home.  Discharge instructions, prescriptions and follow up appointment given to and reviewed with pt.  Pt verbalized understanding.  Escorted by auxillary. 

## 2015-10-02 NOTE — Discharge Summary (Signed)
Physician Discharge Summary  Patient ID: Theresa Sandoval MRN: [id number] DOB/AGE: Apr 27, 1976 40 y.o.  Admit date: 09/30/2015 Discharge date: 10/03/2015  Admission Diagnoses: Chronic Pelvic Pain and Fibroids, Vulvar Lesion  Discharge Diagnoses:  SAA  Operative Procedures: Procedure(s): HYSTERECTOMY ABDOMINAL WITH BILATERAL SALPINGECTOMY (Bilateral); WLE Vulvar Lesion  Hospital Course: Uncomplicated.   Significant Diagnostic Studies:  Lab Results  Component Value Date   HGB 10.3* 10/01/2015   HGB 13.1 09/24/2015   Lab Results  Component Value Date   HCT 39.1 09/24/2015   HCT 40.2 02/01/2015   CBC Latest Ref Rng 10/01/2015 09/24/2015 02/01/2015  WBC 3.6 - 11.0 K/uL - 9.6 10.1  Hemoglobin 12.0 - 16.0 g/dL 10.3(L) 13.1 -  Hematocrit 35.0 - 47.0 % - 39.1 40.2  Platelets 150 - 440 K/uL - 308 271     Discharged Condition: good  Discharge Exam: Blood pressure 162/96, pulse 68, temperature 98.5 F (36.9 C), temperature source Oral, resp. rate 20, height 5\' 5"  (1.651 m), weight 208 lb (94.348 kg), last menstrual period 09/09/2015, SpO2 100 %. Incision/Wound: clean, dry, no drainage and staples intact  Disposition: 01-Home or Self Care      Discharge Instructions    Discharge patient    Complete by:  As directed             Medication List    TAKE these medications        buPROPion 150 MG 24 hr tablet  Commonly known as:  WELLBUTRIN XL  Take 1 tablet (150 mg total) by mouth daily.     citalopram 20 MG tablet  Commonly known as:  CELEXA  Take 1 tablet (20 mg total) by mouth daily.     docusate sodium 100 MG capsule  Commonly known as:  COLACE  Take 1 capsule (100 mg total) by mouth 2 (two) times daily.     ibuprofen 800 MG tablet  Commonly known as:  ADVIL,MOTRIN  Take 1 tablet (800 mg total) by mouth 3 (three) times daily.     oxyCODONE-acetaminophen 5-325 MG tablet  Commonly known as:  PERCOCET/ROXICET  Take 1-2 tablets by mouth every 4 (four) hours as  needed (moderate to severe pain (when tolerating fluids)).     traZODone 50 MG tablet  Commonly known as:  DESYREL  Take 0.5-1 tablets (25-50 mg total) by mouth at bedtime as needed for sleep.     Vitamin D (Ergocalciferol) 50000 units Caps capsule  Commonly known as:  DRISDOL  Take 1 capsule (50,000 Units total) by mouth every 7 (seven) days.       Follow-up Information    Follow up with [name], MD. Go in 2 days.   Specialties:  Obstetrics and Gynecology, Radiology   Why:  For staple removal on 3/3 @ 9:00 am with Dr. Dory Larsen information:   [place] [id number] [phone]       Signed: Alanda Slim Oryon Gary 10/03/2015, 7:38 AM

## 2015-10-04 ENCOUNTER — Ambulatory Visit (INDEPENDENT_AMBULATORY_CARE_PROVIDER_SITE_OTHER): Payer: BLUE CROSS/BLUE SHIELD | Admitting: Obstetrics and Gynecology

## 2015-10-04 VITALS — BP 185/129 | HR 71 | Wt 206.5 lb

## 2015-10-04 DIAGNOSIS — Z4802 Encounter for removal of sutures: Secondary | ICD-10-CM

## 2015-10-04 NOTE — Progress Notes (Signed)
Patient ID: Theresa Sandoval, female   DOB: November 28, 1975, 40 y.o.   MRN: [id number] Pt presents for staple removal. 1 wk. S/P hysterectomy, BSO.  Incision overlap after staple removal opened slightly with tissue color pink, white clear of which I had MNS look at this and she did verify it was okay to proceed with staple removal. There was other areas the same way. Overall incision does not show any s/s of infection. On her abdominal pillow there were a few small spots of yellow drainage. Area clean, no bleeding or purulent drainage. Pt denies fever. Soreness of lower abdominal area. Steri-strips applied. Pt given instructions as to how to shower and when to remove steri-strips. When getting pt's vital signs checking her in, her B/P was elevated and pt states it was at hospital (up/down). B/P range: 192/122 to 185/129 during her visit when sitting and lying down. Pt denied any symptons of elevated B/P such as dizziness, light headness, h/a, change in vision. Dr. Marcelline Mates had to leave for C/S and was unable to see pt but advised her to go to ER or [place] walk in. I strongly encouraged pt to go now and not to go home. She said she was going to [facility] walk in.

## 2015-10-08 ENCOUNTER — Encounter: Payer: Self-pay | Admitting: Obstetrics and Gynecology

## 2015-10-08 ENCOUNTER — Telehealth: Payer: Self-pay

## 2015-10-08 ENCOUNTER — Ambulatory Visit (INDEPENDENT_AMBULATORY_CARE_PROVIDER_SITE_OTHER): Payer: BLUE CROSS/BLUE SHIELD | Admitting: Obstetrics and Gynecology

## 2015-10-08 VITALS — BP 141/88 | HR 81 | Ht 65.0 in | Wt 204.2 lb

## 2015-10-08 DIAGNOSIS — Z09 Encounter for follow-up examination after completed treatment for conditions other than malignant neoplasm: Secondary | ICD-10-CM

## 2015-10-08 DIAGNOSIS — Z90710 Acquired absence of both cervix and uterus: Secondary | ICD-10-CM

## 2015-10-08 NOTE — Progress Notes (Signed)
Chief complaint: 1. One week postop check 2. Status post TAH bilateral salpingectomy and wide local excision of vulva 3. Hypertension   Patient presents for her 1 week postop check. She is doing well with normal bowel and bladder function. Pain is tolerable; she is occasionally using Percocet. She did remove stitch on the vulvar biopsy site because of irritation.  Pathology: Multi-fibroid uterus weighing 1045 g. Fallopian tubes benign Vulvar lesion-VIN 3  OBJECTIVE: BP 141/88 mmHg  Pulse 81  Ht 5\' 5"  (1.651 m)  Wt 204 lb 3.2 oz (92.625 kg)  BMI 33.98 kg/m2  LMP 09/09/2015 Abdomen: Midline incision well approximated and Steri-Stripped. No evidence of hernia or induration Pelvic exam: Right labia majora healing without evidence of inflammation or drainage  ASSESSMENT: 1. Normal postop check 1 week status post TAH bilateral salpingectomy, and wide local excision of right labial minora lesion 2. Pathology: Uterine fibroids, severe dysplasia  PLAN: 1. Continue with routine postoperative precautions 2. Return in 5 weeks for final postop check  [name], MD  Note: This dictation was prepared with Dragon dictation along with smaller phrase technology. Any transcriptional errors that result from this process are unintentional.

## 2015-10-08 NOTE — Telephone Encounter (Signed)
Tried to contact this patient to see how she was doing but there was no answer. A message was left for her to give [place] a call back when she got the chance.

## 2015-10-08 NOTE — Patient Instructions (Signed)
1.  Return in 5 weeks for final postop check 

## 2015-10-10 ENCOUNTER — Telehealth: Payer: Self-pay | Admitting: Obstetrics and Gynecology

## 2015-10-10 NOTE — Telephone Encounter (Signed)
Pt has been coughing since starting lisinopril several days ago. Not taking lisinopril today. Now her incision is oozing a pinkish color. NO foul odor, redness, warm to the touch, and no fevers. Not painful. Sore only. No oozing now. Pt advised to monitor for now. If increases or fever pain not controlled with ibup or pain meds she will need to contact the office.

## 2015-10-10 NOTE — Telephone Encounter (Signed)
Patients Mother Langley Gauss called stating the patient was up all not coughing which has made her incision hurt ,red and oozing a little. You can reach the mother at [phone].Thanks

## 2015-10-11 ENCOUNTER — Ambulatory Visit (INDEPENDENT_AMBULATORY_CARE_PROVIDER_SITE_OTHER): Payer: BLUE CROSS/BLUE SHIELD | Admitting: Obstetrics and Gynecology

## 2015-10-11 VITALS — BP 129/91 | HR 101 | Temp 98.5°F | Wt 198.3 lb

## 2015-10-11 DIAGNOSIS — Z09 Encounter for follow-up examination after completed treatment for conditions other than malignant neoplasm: Secondary | ICD-10-CM

## 2015-10-11 MED ORDER — LOSARTAN POTASSIUM 25 MG PO TABS: 25.0000 mg | ORAL_TABLET | Freq: Every day | ORAL | Status: DC

## 2015-10-11 MED ORDER — ONDANSETRON HCL 4 MG PO TABS: 4.0000 mg | ORAL_TABLET | Freq: Three times a day (TID) | ORAL | Status: DC | PRN

## 2015-10-11 NOTE — Patient Instructions (Signed)
1. Gatorade/Pedialyte fluid intake recommended 2. Emetrol to be used as needed for sampling of systemic 3. Return in 3 days for incision check 4. Return sooner if fever, incisional drainage, or redness, nausea and vomiting develop

## 2015-10-11 NOTE — Progress Notes (Signed)
Chief complaint: 1. Wound check  Patient presents for incisional drainage evaluation. No fever chills or sweats. Patient has been having nausea with vomiting. Minimal flatus.  OBJECTIVE: BP 129/91 mmHg  Pulse 101  Temp(Src) 98.5 F (36.9 C)  Wt 198 lb 5 oz (89.954 kg)  LMP 09/09/2015 Pleasant female in no acute distress Abdomen: Soft, no peritoneal signs, no distention; midline incision with superficial separation in the inferior third of the incision (Steri-Strips were applied); superior aspect of incision with 5 mm defect, probed with Q-tip soaked in one half and one half normal saline/hydroperoxide. One half inch nugauze  Plain packing placed.  ASSESSMENT: 1. Incision seroma 2. No evidence of acute infection 3. Mild nausea and vomiting without evidence of bowel obstruction. 4. Hypertension, lisinopril not tolerated due to cough side effect   PLAN: 1. Return in 3 days for wound assessment 2. Gatorade/Pedialyte when necessary 3. Emetrol when necessary 4. Return for fever, persistent nausea and vomiting, or evidence of wound infection. 5. Discontinue lisinopril 6. Begin losartan 25 mg  [name], MD  Note: This dictation was prepared with Dragon dictation along with smaller phrase technology. Any transcriptional errors that result from this process are unintentional.

## 2015-10-12 ENCOUNTER — Emergency Department: Payer: BLUE CROSS/BLUE SHIELD

## 2015-10-12 ENCOUNTER — Inpatient Hospital Stay
Admission: EM | Admit: 2015-10-12 | Discharge: 2015-10-17 | DRG: 355 | Disposition: A | Discharge status: Home / Self Care | Payer: BLUE CROSS/BLUE SHIELD | Attending: Surgery | Admitting: Surgery

## 2015-10-12 ENCOUNTER — Emergency Department: Payer: BLUE CROSS/BLUE SHIELD | Admitting: Certified Registered Nurse Anesthetist

## 2015-10-12 ENCOUNTER — Encounter: Payer: Self-pay | Admitting: Emergency Medicine

## 2015-10-12 ENCOUNTER — Encounter
Admission: EM | Disposition: A | Discharge status: Home / Self Care | Payer: Self-pay | Source: Home / Self Care | Attending: Surgery

## 2015-10-12 DIAGNOSIS — F329 Major depressive disorder, single episode, unspecified: Secondary | ICD-10-CM | POA: Diagnosis present

## 2015-10-12 DIAGNOSIS — R109 Unspecified abdominal pain: Secondary | ICD-10-CM | POA: Diagnosis present

## 2015-10-12 DIAGNOSIS — T8149XA Infection following a procedure, other surgical site, initial encounter: Secondary | ICD-10-CM | POA: Insufficient documentation

## 2015-10-12 DIAGNOSIS — N390 Urinary tract infection, site not specified: Secondary | ICD-10-CM

## 2015-10-12 DIAGNOSIS — R1031 Right lower quadrant pain: Secondary | ICD-10-CM | POA: Diagnosis not present

## 2015-10-12 DIAGNOSIS — Z6832 Body mass index (BMI) 32.0-32.9, adult: Secondary | ICD-10-CM | POA: Diagnosis not present

## 2015-10-12 DIAGNOSIS — T814XXA Infection following a procedure, initial encounter: Secondary | ICD-10-CM | POA: Diagnosis not present

## 2015-10-12 DIAGNOSIS — E876 Hypokalemia: Secondary | ICD-10-CM | POA: Diagnosis present

## 2015-10-12 DIAGNOSIS — K430 Incisional hernia with obstruction, without gangrene: Secondary | ICD-10-CM | POA: Diagnosis present

## 2015-10-12 DIAGNOSIS — E669 Obesity, unspecified: Secondary | ICD-10-CM | POA: Diagnosis present

## 2015-10-12 DIAGNOSIS — K5669 Other intestinal obstruction: Secondary | ICD-10-CM

## 2015-10-12 DIAGNOSIS — Z79899 Other long term (current) drug therapy: Secondary | ICD-10-CM | POA: Diagnosis not present

## 2015-10-12 DIAGNOSIS — Z8719 Personal history of other diseases of the digestive system: Secondary | ICD-10-CM | POA: Diagnosis present

## 2015-10-12 DIAGNOSIS — F17210 Nicotine dependence, cigarettes, uncomplicated: Secondary | ICD-10-CM | POA: Diagnosis present

## 2015-10-12 DIAGNOSIS — K460 Unspecified abdominal hernia with obstruction, without gangrene: Secondary | ICD-10-CM

## 2015-10-12 DIAGNOSIS — Z90710 Acquired absence of both cervix and uterus: Secondary | ICD-10-CM

## 2015-10-12 DIAGNOSIS — I10 Essential (primary) hypertension: Secondary | ICD-10-CM | POA: Diagnosis present

## 2015-10-12 DIAGNOSIS — K56609 Unspecified intestinal obstruction, unspecified as to partial versus complete obstruction: Secondary | ICD-10-CM

## 2015-10-12 DIAGNOSIS — Z9889 Other specified postprocedural states: Secondary | ICD-10-CM

## 2015-10-12 HISTORY — PX: VENTRAL HERNIA REPAIR: SHX424

## 2015-10-12 LAB — URINALYSIS COMPLETE WITH MICROSCOPIC (ARMC ONLY)
GLUCOSE, UA: NEGATIVE mg/dL
Ketones, ur: NEGATIVE mg/dL
Nitrite: NEGATIVE
Protein, ur: 100 mg/dL — AB
Specific Gravity, Urine: 1.035 — ABNORMAL HIGH (ref 1.005–1.030)
pH: 5 (ref 5.0–8.0)

## 2015-10-12 LAB — COMPREHENSIVE METABOLIC PANEL
ALBUMIN: 4.3 g/dL (ref 3.5–5.0)
ALT: 12 U/L — AB (ref 14–54)
AST: 16 U/L (ref 15–41)
Alkaline Phosphatase: 68 U/L (ref 38–126)
Anion gap: 7 (ref 5–15)
BUN: 27 mg/dL — AB (ref 6–20)
CHLORIDE: 101 mmol/L (ref 101–111)
CO2: 29 mmol/L (ref 22–32)
CREATININE: 1.55 mg/dL — AB (ref 0.44–1.00)
Calcium: 8.8 mg/dL — ABNORMAL LOW (ref 8.9–10.3)
GFR calc Af Amer: 48 mL/min — ABNORMAL LOW (ref >60)
GFR calc non Af Amer: 41 mL/min — ABNORMAL LOW (ref >60)
GLUCOSE: 136 mg/dL — AB (ref 65–99)
POTASSIUM: 3.1 mmol/L — AB (ref 3.5–5.1)
SODIUM: 137 mmol/L (ref 135–145)
Total Bilirubin: 0.8 mg/dL (ref 0.3–1.2)
Total Protein: 8.1 g/dL (ref 6.5–8.1)

## 2015-10-12 LAB — LIPASE, BLOOD: LIPASE: 16 U/L (ref 11–51)

## 2015-10-12 LAB — CBC
HCT: 40.3 % (ref 35.0–47.0)
Hemoglobin: 13.6 g/dL (ref 12.0–16.0)
MCH: 30.2 pg (ref 26.0–34.0)
MCHC: 33.8 g/dL (ref 32.0–36.0)
MCV: 89.5 fL (ref 80.0–100.0)
PLATELETS: 460 10*3/uL — AB (ref 150–440)
RBC: 4.5 MIL/uL (ref 3.80–5.20)
RDW: 13.9 % (ref 11.5–14.5)
WBC: 14.5 10*3/uL — ABNORMAL HIGH (ref 3.6–11.0)

## 2015-10-12 SURGERY — REPAIR, HERNIA, VENTRAL
Anesthesia: General | Wound class: Dirty or Infected

## 2015-10-12 MED ORDER — LIDOCAINE HCL (CARDIAC) 20 MG/ML IV SOLN
INTRAVENOUS | Status: DC | PRN
  Administered 2015-10-12: 80 mg via INTRAVENOUS

## 2015-10-12 MED ORDER — ACETAMINOPHEN 10 MG/ML IV SOLN
INTRAVENOUS | Status: DC | PRN
  Administered 2015-10-12: 1000 mg via INTRAVENOUS

## 2015-10-12 MED ORDER — BUPIVACAINE-EPINEPHRINE (PF) 0.25% -1:200000 IJ SOLN
INTRAMUSCULAR | Status: AC
  Filled 2015-10-12: qty 30

## 2015-10-12 MED ORDER — DEXTROSE 5 % IV SOLN
2.0000 g | Freq: Once | INTRAVENOUS | Status: AC
  Administered 2015-10-12: 2 g via INTRAVENOUS
  Filled 2015-10-12: qty 2

## 2015-10-12 MED ORDER — IOHEXOL 240 MG/ML SOLN
25.0000 mL | INTRAMUSCULAR | Status: AC
  Administered 2015-10-12: 50 mL via ORAL

## 2015-10-12 MED ORDER — HYDROMORPHONE HCL 1 MG/ML IJ SOLN
1.0000 mg | INTRAMUSCULAR | Status: DC | PRN
  Administered 2015-10-13 – 2015-10-17 (×9): 1 mg via INTRAVENOUS
  Filled 2015-10-12 (×10): qty 1

## 2015-10-12 MED ORDER — ONDANSETRON HCL 4 MG/2ML IJ SOLN
4.0000 mg | Freq: Once | INTRAMUSCULAR | Status: AC
  Administered 2015-10-12: 4 mg via INTRAVENOUS
  Filled 2015-10-12: qty 2

## 2015-10-12 MED ORDER — NEOSTIGMINE METHYLSULFATE 10 MG/10ML IV SOLN
INTRAVENOUS | Status: DC | PRN
  Administered 2015-10-12: 3 mg via INTRAVENOUS

## 2015-10-12 MED ORDER — FENTANYL CITRATE (PF) 100 MCG/2ML IJ SOLN
INTRAMUSCULAR | Status: AC
  Administered 2015-10-12: 25 ug via INTRAVENOUS
  Filled 2015-10-12: qty 2

## 2015-10-12 MED ORDER — MORPHINE SULFATE (PF) 4 MG/ML IV SOLN
4.0000 mg | Freq: Once | INTRAVENOUS | Status: AC
  Administered 2015-10-12: 4 mg via INTRAVENOUS
  Filled 2015-10-12: qty 1

## 2015-10-12 MED ORDER — DEXAMETHASONE SODIUM PHOSPHATE 10 MG/ML IJ SOLN
INTRAMUSCULAR | Status: DC | PRN
  Administered 2015-10-12: 5 mg via INTRAVENOUS

## 2015-10-12 MED ORDER — IOHEXOL 300 MG/ML  SOLN: 100.0000 mL | Freq: Once | INTRAMUSCULAR | Status: DC | PRN

## 2015-10-12 MED ORDER — MIDAZOLAM HCL 2 MG/2ML IJ SOLN
INTRAMUSCULAR | Status: DC | PRN
  Administered 2015-10-12: 1 mg via INTRAVENOUS

## 2015-10-12 MED ORDER — BUPROPION HCL ER (XL) 150 MG PO TB24
150.0000 mg | ORAL_TABLET | Freq: Every day | ORAL | Status: DC
  Administered 2015-10-13 – 2015-10-17 (×5): 150 mg via ORAL
  Filled 2015-10-12 (×5): qty 1

## 2015-10-12 MED ORDER — ACETAMINOPHEN 10 MG/ML IV SOLN
INTRAVENOUS | Status: AC
  Filled 2015-10-12: qty 100

## 2015-10-12 MED ORDER — SUCCINYLCHOLINE CHLORIDE 20 MG/ML IJ SOLN
INTRAMUSCULAR | Status: DC | PRN
  Administered 2015-10-12: 100 mg via INTRAVENOUS

## 2015-10-12 MED ORDER — BUPIVACAINE-EPINEPHRINE (PF) 0.25% -1:200000 IJ SOLN
INTRAMUSCULAR | Status: DC | PRN
  Administered 2015-10-12: 30 mL via PERINEURAL

## 2015-10-12 MED ORDER — ONDANSETRON HCL 4 MG/2ML IJ SOLN: 4.0000 mg | Freq: Once | INTRAMUSCULAR | Status: DC | PRN

## 2015-10-12 MED ORDER — PROPOFOL 10 MG/ML IV BOLUS
INTRAVENOUS | Status: DC | PRN
  Administered 2015-10-12: 150 mg via INTRAVENOUS

## 2015-10-12 MED ORDER — HEPARIN SODIUM (PORCINE) 5000 UNIT/ML IJ SOLN
5000.0000 [IU] | Freq: Three times a day (TID) | INTRAMUSCULAR | Status: DC
  Administered 2015-10-12: 5000 [IU] via SUBCUTANEOUS
  Filled 2015-10-12: qty 1

## 2015-10-12 MED ORDER — DEXTROSE IN LACTATED RINGERS 5 % IV SOLN
INTRAVENOUS | Status: DC
  Administered 2015-10-12: 150 mL/h via INTRAVENOUS
  Administered 2015-10-13 – 2015-10-16 (×7): via INTRAVENOUS

## 2015-10-12 MED ORDER — HYDROMORPHONE HCL 1 MG/ML IJ SOLN
INTRAMUSCULAR | Status: AC
  Administered 2015-10-12: 0.5 mg via INTRAVENOUS
  Filled 2015-10-12: qty 1

## 2015-10-12 MED ORDER — SODIUM CHLORIDE 0.9 % IV BOLUS (SEPSIS)
1000.0000 mL | Freq: Once | INTRAVENOUS | Status: AC
  Administered 2015-10-12: 1000 mL via INTRAVENOUS

## 2015-10-12 MED ORDER — DEXTROSE 5 % IV SOLN
1.0000 g | Freq: Once | INTRAVENOUS | Status: AC
  Administered 2015-10-12: 1 g via INTRAVENOUS
  Filled 2015-10-12: qty 10

## 2015-10-12 MED ORDER — DEXTROSE 5 % IV SOLN
1.0000 g | Freq: Four times a day (QID) | INTRAVENOUS | Status: DC
  Administered 2015-10-13 – 2015-10-17 (×18): 1 g via INTRAVENOUS
  Filled 2015-10-12 (×23): qty 1

## 2015-10-12 MED ORDER — ONDANSETRON HCL 4 MG/2ML IJ SOLN
4.0000 mg | Freq: Four times a day (QID) | INTRAMUSCULAR | Status: DC | PRN
  Administered 2015-10-14: 4 mg via INTRAVENOUS
  Filled 2015-10-12: qty 2

## 2015-10-12 MED ORDER — CITALOPRAM HYDROBROMIDE 20 MG PO TABS
20.0000 mg | ORAL_TABLET | Freq: Every day | ORAL | Status: DC
  Administered 2015-10-13 – 2015-10-17 (×5): 20 mg via ORAL
  Filled 2015-10-12 (×5): qty 1

## 2015-10-12 MED ORDER — GLYCOPYRROLATE 0.2 MG/ML IJ SOLN
INTRAMUSCULAR | Status: DC | PRN
  Administered 2015-10-12: 0.2 mg via INTRAVENOUS
  Administered 2015-10-12: 0.4 mg via INTRAVENOUS

## 2015-10-12 MED ORDER — HEPARIN SODIUM (PORCINE) 5000 UNIT/ML IJ SOLN
5000.0000 [IU] | Freq: Three times a day (TID) | INTRAMUSCULAR | Status: DC
  Administered 2015-10-13 – 2015-10-17 (×13): 5000 [IU] via SUBCUTANEOUS
  Filled 2015-10-12 (×13): qty 1

## 2015-10-12 MED ORDER — FENTANYL CITRATE (PF) 100 MCG/2ML IJ SOLN
INTRAMUSCULAR | Status: DC | PRN
  Administered 2015-10-12 (×2): 50 ug via INTRAVENOUS

## 2015-10-12 MED ORDER — HYDROMORPHONE HCL 1 MG/ML IJ SOLN
0.2500 mg | INTRAMUSCULAR | Status: DC | PRN
  Administered 2015-10-12 (×4): 0.5 mg via INTRAVENOUS

## 2015-10-12 MED ORDER — FENTANYL CITRATE (PF) 100 MCG/2ML IJ SOLN
25.0000 ug | INTRAMUSCULAR | Status: DC | PRN
  Administered 2015-10-12 (×4): 25 ug via INTRAVENOUS

## 2015-10-12 MED ORDER — ONDANSETRON HCL 4 MG PO TABS
4.0000 mg | ORAL_TABLET | Freq: Four times a day (QID) | ORAL | Status: DC | PRN
Start: 2015-10-12 — End: 2015-10-17

## 2015-10-12 MED ORDER — LACTATED RINGERS IV SOLN
INTRAVENOUS | Status: DC | PRN
Start: 2015-10-12 — End: 2015-10-12
  Administered 2015-10-12: 22:00:00 via INTRAVENOUS

## 2015-10-12 SURGICAL SUPPLY — 34 items
ADHESIVE MASTISOL STRL (MISCELLANEOUS) IMPLANT
BARRIER ADH SEPRAFILM 3INX5IN (MISCELLANEOUS) ×3 IMPLANT
CANISTER SUCT 1200ML W/VALVE (MISCELLANEOUS) ×3 IMPLANT
CHLORAPREP W/TINT 26ML (MISCELLANEOUS) ×3 IMPLANT
CLOSURE WOUND 1/2 X4 (GAUZE/BANDAGES/DRESSINGS)
DRAIN PENROSE 1/4X12 LTX (DRAIN) ×3 IMPLANT
DRAPE LAPAROTOMY 100X77 ABD (DRAPES) ×3 IMPLANT
ELECT REM PT RETURN 9FT ADLT (ELECTROSURGICAL) ×3
ELECTRODE REM PT RTRN 9FT ADLT (ELECTROSURGICAL) ×1 IMPLANT
ETHIBOND 2 0 GREEN CT 2 30IN (SUTURE) IMPLANT
GAUZE SPONGE 4X4 12PLY STRL (GAUZE/BANDAGES/DRESSINGS) ×3 IMPLANT
GLOVE BIO SURGEON STRL SZ8 (GLOVE) ×12 IMPLANT
GOWN STRL REUS W/ TWL LRG LVL3 (GOWN DISPOSABLE) ×2 IMPLANT
GOWN STRL REUS W/TWL LRG LVL3 (GOWN DISPOSABLE) ×4
KIT RM TURNOVER STRD PROC AR (KITS) ×3 IMPLANT
LABEL OR SOLS (LABEL) IMPLANT
LIQUID BAND (GAUZE/BANDAGES/DRESSINGS) ×3 IMPLANT
NDL SAFETY 22GX1.5 (NEEDLE) ×3 IMPLANT
NS IRRIG 500ML POUR BTL (IV SOLUTION) ×3 IMPLANT
PACK BASIN MINOR ARMC (MISCELLANEOUS) ×3 IMPLANT
PAD ABD DERMACEA PRESS 5X9 (GAUZE/BANDAGES/DRESSINGS) ×6 IMPLANT
SPONGE LAP 18X18 5 PK (GAUZE/BANDAGES/DRESSINGS) ×3 IMPLANT
STAPLER SKIN PROX 35W (STAPLE) ×3 IMPLANT
STRIP CLOSURE SKIN 1/2X4 (GAUZE/BANDAGES/DRESSINGS) IMPLANT
SUT MNCRL 4-0 (SUTURE)
SUT MNCRL 4-0 27XMFL (SUTURE)
SUT PROLENE 0 CT 1 30 (SUTURE) IMPLANT
SUT PROLENE 0 CTX CR/8 (SUTURE) ×3 IMPLANT
SUT VIC AB 0 CT2 27 (SUTURE) IMPLANT
SUT VIC AB 3-0 SH 27 (SUTURE)
SUT VIC AB 3-0 SH 27X BRD (SUTURE) IMPLANT
SUTURE MNCRL 4-0 27XMF (SUTURE) IMPLANT
SWAB DUAL CULTURE TRANS RED ST (MISCELLANEOUS) ×3 IMPLANT
SYRINGE 10CC LL (SYRINGE) ×3 IMPLANT

## 2015-10-12 NOTE — ED Notes (Signed)
Pt requesting ice. Pt informed cannot have ice until surgeon sees patient.

## 2015-10-12 NOTE — Progress Notes (Signed)
Patient seen again in the emergency room well seen on the patient and waiting for operative time. No change in the patient's subjective or objective findings.  I discussed with she and her mother the rationale for surgery again they had no further questions concerning the risks that I discussed previously including bleeding infection mesh placement mesh infection the high risk of recurrence and possible bio mesh utilization and bowel resection this is all reviewed for them and they understood questions were answered.  I will attempt to call Dr. Richrd Humbles concerning my plans.

## 2015-10-12 NOTE — ED Notes (Signed)
Pt transported to CT ?

## 2015-10-12 NOTE — ED Provider Notes (Signed)
CT Abdomen Pelvis W Contrast (Final result) Result time: 10/12/15 16:43:00   Final result by Rad Results In Interface (10/12/15 16:43:00)   Narrative:   CLINICAL DATA: Abdominal pain, nausea, vomiting. Recent hysterectomy.  EXAM: CT ABDOMEN AND PELVIS WITH CONTRAST  TECHNIQUE: Multidetector CT imaging of the abdomen and pelvis was performed using the standard protocol following bolus administration of intravenous contrast.  CONTRAST: 100 cc Omnipaque 300 IV  COMPARISON: None.  FINDINGS: Rounded soft tissue density noted peripherally in the right lung base measuring 19 mm on image 14. Triangular density peripherally at the left lung base likely reflects scarring. No pleural effusions. Heart is normal size.  Mild diffuse fatty infiltration of the liver. No focal abnormality. Gallbladder, spleen, pancreas, left adrenal and kidneys are normal. Small nodule in the right adrenal gland measures 14 mm and is nonspecific but likely reflects a small adenoma.  There are dilated small bowel loops with air-fluid levels. There is an umbilical hernia containing a knuckle of small bowel, likely the cause of the bowel obstruction. In addition, the bowel within the hernia is thick walled as well as bowel just proximal and distal to the hernia. This is concerning for incarceration and at risk bowel. The colon is decompressed.  There is a small amount of free fluid in the left paracolic gutter and within the mesentery. Small scattered mesenteric lymph nodes, none pathologically enlarged. Aorta is normal caliber.  Prior hysterectomy. No adnexal masses. Urinary bladder is unremarkable.  No acute bony abnormality or focal bone lesion.  IMPRESSION: Umbilical hernia containing small bowel loop with small bowel obstruction. In addition, the herniated bowel is thick walled as is bowel just proximal to and distal to the hernia. This is concerning for incarceration and at-risk bowel.  19  mm peripheral nodule at the right lung base. This warrants follow-up with repeat chest CT in 6 months to assess for stability or change.  Small right adrenal nodule, nonspecific but most likely small adenoma.   Electronically Signed By: Rolm Baptise M.D. On: 10/12/2015 16:43    ----------------------------------------- 6:24 PM on 10/12/2015 -----------------------------------------  Place consultation with Dr. Connye Burkitt surgery, she reviewed CT images come to see patient now for concerns of bowel obstruction. In addition I have paged gynecology to notify them as the patient is recently postoperative.  Patient requesting pain medicine, we'll give morphine 4 mg now. She is awake and alert is in no distress but does appear to be in pain. She has significant tenderness in the region of her incision just below the umbilical hernia.  Patient admitted to general surgery.  Delman Kitten, MD 10/12/15 [phone]

## 2015-10-12 NOTE — Anesthesia Procedure Notes (Signed)
Procedure Name: Intubation Date/Time: 10/12/2015 9:55 PM Performed by: Johnna Acosta Pre-anesthesia Checklist: Patient identified, Emergency Drugs available, Suction available, Patient being monitored and Timeout performed Patient Re-evaluated:Patient Re-evaluated prior to inductionOxygen Delivery Method: Circle system utilized Preoxygenation: Pre-oxygenation with 100% oxygen Intubation Type: IV induction, Rapid sequence and Cricoid Pressure applied Laryngoscope Size: Miller and 2 Grade View: Grade III Tube type: Oral Tube size: 7.0 mm Number of attempts: 1 Airway Equipment and Method: Stylet Placement Confirmation: ETT inserted through vocal cords under direct vision,  positive ETCO2 and breath sounds checked- equal and bilateral Secured at: 20 cm Tube secured with: Tape Dental Injury: Teeth and Oropharynx as per pre-operative assessment

## 2015-10-12 NOTE — ED Notes (Signed)
Patient transported to CT 

## 2015-10-12 NOTE — Anesthesia Preprocedure Evaluation (Signed)
Anesthesia Evaluation  Patient identified by MRN, date of birth, ID band Patient awake    Reviewed: Allergy & Precautions, NPO status , Patient's Chart, lab work & pertinent test results, reviewed documented beta blocker date and time   Airway Mallampati: III  TM Distance: >3 FB     Dental  (+) Chipped   Pulmonary Current Smoker,           Cardiovascular hypertension,      Neuro/Psych PSYCHIATRIC DISORDERS Depression    GI/Hepatic   Endo/Other    Renal/GU      Musculoskeletal   Abdominal   Peds  Hematology   Anesthesia Other Findings Obese. Hypertensive.  Reproductive/Obstetrics                             Anesthesia Physical Anesthesia Plan  ASA: III  Anesthesia Plan: General   Post-op Pain Management:    Induction: Intravenous  Airway Management Planned: Oral ETT  Additional Equipment:   Intra-op Plan:   Post-operative Plan:   Informed Consent: I have reviewed the patients History and Physical, chart, labs and discussed the procedure including the risks, benefits and alternatives for the proposed anesthesia with the patient or authorized representative who has indicated his/her understanding and acceptance.     Plan Discussed with: CRNA  Anesthesia Plan Comments:         Anesthesia Quick Evaluation

## 2015-10-12 NOTE — ED Notes (Signed)
Pt to ed with c/o abd pain and vomiting.  States she had hyst on Feb 27th,  Went to see md yesterday for pain and vomiting.

## 2015-10-12 NOTE — Transfer of Care (Signed)
Immediate Anesthesia Transfer of Care Note  Patient: Theresa Sandoval  Procedure(s) Performed: Procedure(s): Repair of incarcerated ventral hernia  (N/A)  Patient Location: PACU  Anesthesia Type:General  Level of Consciousness: awake, alert  and oriented  Airway & Oxygen Therapy: Patient Spontanous Breathing and Patient connected to nasal cannula oxygen  Post-op Assessment: Report given to RN and Post -op Vital signs reviewed and stable  Post vital signs: Reviewed and stable  Last Vitals:  Filed Vitals:   10/12/15 2030 10/12/15 2048  BP: 138/87 138/87  Pulse:  73  Temp:    Resp: 16 18    Complications: No apparent anesthesia complications

## 2015-10-12 NOTE — ED Provider Notes (Signed)
[facility] Emergency Department Provider Note  ____________________________________________    I have reviewed the triage vital signs and the nursing notes.   HISTORY  Chief Complaint Abdominal Pain and Emesis    HPI Theresa Sandoval is a 40 y.o. female who presents with complaints ofabdominal pain and nausea and vomiting. Patient reports she had a hysterectomy on February 27 and saw Dr. Enzo Bi yesterday with complaints of nausea and vomiting. He gave her Zofran but she reports she is not improved. She denies fevers or chills. Apparently she has had some mild wound dehiscence which Dr. Enzo Bi packed. She denies dysuria. She reports her pain is cramping in nature primarily on the right side of her abdomen and flank    Past Medical History  Diagnosis Date  . Fibroids   . Depression   . Insomnia   . Vitamin D deficiency   . Hypertension     Patient Active Problem List   Diagnosis Date Noted  . S/P abdominal hysterectomy 09/30/2015  . 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 Menorrhagia 04/10/2015  . Vulvar lesion 04/10/2015  . Prediabetes 03/15/2015  . Vitamin D deficiency 03/15/2015  . Tobacco abuse 03/15/2015  . Uterine fibroid 02/19/2015  . Obesity (BMI 30.0-34.9) 02/01/2015  . Anxiety and depression 02/01/2015  . Insomnia 02/01/2015  . EKG, abnormal 02/01/2015  . Microalbuminuria 02/01/2015    Past Surgical History  Procedure Laterality Date  . Tubal ligation    . [place]  . Vuvlar lesion      Current Outpatient Rx  Name  Route  Sig  Dispense  Refill  . buPROPion (WELLBUTRIN XL) 150 MG 24 hr tablet   Oral   Take 1 tablet (150 mg total) by mouth daily.   30 tablet   2   . citalopram (CELEXA) 20 MG tablet   Oral   Take 1 tablet (20 mg total) by mouth daily.   90 tablet   .0   . docusate sodium (COLACE) 100 MG capsule   Oral   Take 1 capsule (100 mg total) by  mouth 2 (two) times daily.   10 capsule   0   . ibuprofen (ADVIL,MOTRIN) 800 MG tablet   Oral   Take 1 tablet (800 mg total) by mouth 3 (three) times daily.   30 tablet   0   . losartan (COZAAR) 25 MG tablet   Oral   Take 1 tablet (25 mg total) by mouth daily.   30 tablet   2   . ondansetron (ZOFRAN) 4 MG tablet   Oral   Take 1 tablet (4 mg total) by mouth every 8 (eight) hours as needed for nausea or vomiting.   20 tablet   0   . oxyCODONE-acetaminophen (PERCOCET/ROXICET) 5-325 MG tablet   Oral   Take 1-2 tablets by mouth every 4 (four) hours as needed (moderate to severe pain (when tolerating fluids)).   30 tablet   0   . traZODone (DESYREL) 50 MG tablet   Oral   Take 0.5-1 tablets (25-50 mg total) by mouth at bedtime as needed for sleep.   90 tablet   1   . Vitamin D, Ergocalciferol, (DRISDOL) 50000 UNITS CAPS capsule   Oral   Take 1 capsule (50,000 Units total) by mouth every 7 (seven) days.   12 capsule   1     Allergies Review of patient's allergies indicates no known allergies.  Family History  Problem Relation Age of Onset  . Hypertension Mother   . Hyperlipidemia Mother   . Breast cancer Maternal Grandmother   . Diabetes Maternal Grandmother   . Ovarian cancer Neg Hx   . Colon cancer Neg Hx   . Heart disease Neg Hx     Social History Social History  Substance Use Topics  . Smoking status: Current Every Day Smoker -- 0.50 packs/day for 15 years    Types: Cigarettes    Start date: 02/01/2000  . Smokeless tobacco: Never Used  . Alcohol Use: 2.4 oz/week    4 Standard drinks or equivalent per week     Comment: wine/beer     Review of Systems  Constitutional: Negative for fever. Eyes: Negative for visual changes. ENT: Negative for sore throat Cardiovascular: Negative for chest pain. Respiratory: Negative for cough  Gastrointestinal: Positive as above Genitourinary: Negative for dysuria. Musculoskeletal: Negative for back pain. Skin:  Negative for rash. Neurological: Negative for headaches  Psychiatric: No anxiety    ____________________________________________   PHYSICAL EXAM:  VITAL SIGNS: ED Triage Vitals  Enc Vitals Group     BP 10/12/15 1240 147/97 mmHg     Pulse Rate 10/12/15 1240 87     Resp 10/12/15 1240 18     Temp 10/12/15 1240 98 F (36.7 C)     Temp Source 10/12/15 1240 Oral     SpO2 10/12/15 1240 94 %     Weight 10/12/15 1240 198 lb (89.812 kg)     Height 10/12/15 1240 5\' 5"  (1.651 m)     Head Cir --      Peak Flow --      Pain Score 10/12/15 1240 10     Pain Loc --      Pain Edu? --      Excl. in [place]? --      Constitutional: Alert and oriented. Well appearing and in no distress. Eyes: Conjunctivae are normal.  ENT   Head: Normocephalic and atraumatic.   Mouth/Throat: Mucous membranes are moist. Cardiovascular: Normal rate, regular rhythm. Normal and symmetric distal pulses are present in all extremities. Respiratory: Normal respiratory effort without tachypnea nor retractions. Gastrointestinal: Mild tenderness to palpation in the right upper and right lower quadrant. No distention. There is no CVA tenderness. Wounds are CDI Genitourinary: deferred Musculoskeletal: Nontender with normal range of motion in all extremities.  Neurologic:  Normal speech and language. No gross focal neurologic deficits are appreciated. Skin:  Skin is warm, dry and intact. No rash noted. Psychiatric: Mood and affect are normal. Patient exhibits appropriate insight and judgment.  ____________________________________________    LABS (pertinent positives/negatives)  Labs Reviewed  COMPREHENSIVE METABOLIC PANEL - Abnormal; Notable for the following:    Potassium 3.1 (*)    Glucose, Bld 136 (*)    BUN 27 (*)    Creatinine, Ser 1.55 (*)    Calcium 8.8 (*)    ALT 12 (*)    GFR calc non Af Amer 41 (*)    GFR calc Af Amer 48 (*)    All other components within normal limits  CBC - Abnormal; Notable  for the following:    WBC 14.5 (*)    Platelets 460 (*)    All other components within normal limits  URINALYSIS COMPLETEWITH MICROSCOPIC ([facility] ONLY) - Abnormal; Notable for the following:    Color, Urine AMBER (*)    APPearance CLOUDY (*)    Bilirubin Urine 1+ (*)    Specific  Gravity, Urine 1.035 (*)    Hgb urine dipstick 2+ (*)    Protein, ur 100 (*)    Leukocytes, UA 2+ (*)    Bacteria, UA MANY (*)    Squamous Epithelial / LPF 6-30 (*)    All other components within normal limits  LIPASE, BLOOD    ____________________________________________   EKG  None  ____________________________________________    RADIOLOGY I have personally reviewed any xrays that were ordered on this patient: CT abdomen and pelvis pending  ____________________________________________   PROCEDURES  Procedure(s) performed: none  Critical Care performed: none  ____________________________________________   INITIAL IMPRESSION / ASSESSMENT AND PLAN / ED COURSE  Pertinent labs & imaging results that were available during my care of the patient were reviewed by me and considered in my medical decision making (see chart for details).  Patient presents with primarily right-sided abdominal pain. She has had a recent significant surgery. She does report normal bowel movements but is having nausea and vomiting. We will obtain labs CT abdomen and pelvis and reevaluate.  I have asked Dr. Jacqualine Code to follow up on CT abd/pelvis and discuss with Dr. Enzo Bi. ____________________________________________   FINAL CLINICAL IMPRESSION(S) / ED DIAGNOSES  Urinary tract infection Abdominal pain   Lavonia Drafts, MD 10/12/15 1504

## 2015-10-12 NOTE — Op Note (Signed)
Abdominal Hernia Repair  Pre-operative Diagnosis: Incarcerated ventral hernia  Post-operative Diagnosis: Incarcerated ventral hernia  Surgeon: Jerrol Banana. Burt Knack, MD FACS  Anesthesia: Gen. with endotracheal tube  Assistant: Surgical tech  Procedure: Repair of incarcerated ventral hernia with primary closure  Procedure Details  The patient was seen again in the Holding Room. The benefits, complications, treatment options, and expected outcomes were discussed with the patient. The risks of bleeding, infection, recurrence of symptoms, failure to resolve symptoms, bowel injury, mesh placement, mesh infection, any of which could require further surgery were reviewed with the patient. The likelihood of improving the patient's symptoms with return to their baseline status is good.  We discussed and emphasized the importance of not being able to place typical plastic-type mesh due to the high risk of infection and the potential for using a bio mesh versus primary closure with its high risk of recurrence .The patient and/or family concurred with the proposed plan, giving informed consent.  The patient was taken to Operating Room, identified as [facility] and the procedure verified.  A Time Out was held and the above information confirmed.  Prior to the induction of general anesthesia, antibiotic prophylaxis was administered. VTE prophylaxis was in place. General endotracheal anesthesia was then administered and tolerated well. After the induction, the abdomen was prepped with Chloraprep and draped in the sterile fashion. The patient was positioned in the supine position.  The patient was prepped and draped in a sterile fashion as above and then the wound was inspected. Prior to prepping and draping approximately 18 inches of quarter-inch Nu Gauze was removed from the packed wound and the Steri-Strips of been removed revealing another opening caudad to the one with the packing.  The wound was opened  bluntly revealing a viable thickened piece of small bowel loop. Is extensive serosanguineous but somewhat cloudy fluid which was cultured. PDS or Maxon sutures were removed and the fascial opening was enlarged by removing the existing sutures. This required somewhat cephalad extension of the incision. The small bowel was eviscerated and inspected and found to be thickened but certainly viable and there was no rent or tear or leakage from the small bowel.  With the findings of the cloudy fluid and the compromised but viable bowel it was decided to not place mesh in this patient due to the high risk of infection. Once assuring that the bowel was viable etc. and the sponge lap needle count was correct at piece of Seprafilm was placed and then the wound was closed with interrupted figure-of-eight 0 Prolene sutures. Marcaine was infiltrated into the skin and subcutaneous tissues tissues for a total of 30 cc and then the wound was irrigated with copious amounts normal saline and a quarter-inch Penrose drain was placed into the wound and held in with staples the midline wound was closed with loosely approximating staples. And a sterile dressing was placed  Patient thought of this procedure well there were a couple occasions she was taken to recovery room in stable condition to be admitted for continued care sponge lap needle count was correct  Findings:  Small bowel incarceration in ventral hernia.  Estimated Blood Loss: 5 cc         Drains: Penrose 1 in the subcutaneous space         Specimens: Wound fluid culture          Complications: none               Ivannah Zody E. Burt Knack, MD,  FACS

## 2015-10-12 NOTE — H&P (Signed)
Theresa Sandoval is an 40 y.o. female.    Chief Complaint: Abdominal pain  HPI: This patient with abdominal pain she is approximately 2 weeks status post hysterectomy performed via an open procedure by Dr. Richrd Humbles. She experienced drainage from her wound 2 or 3 days ago and has had pain for the last week which is worsening over the last few days she's had nausea and vomiting today she's not passing gas nor she had a bowel movement today. She denies fevers or chills. She's never had an episode like this before.  Past Medical History  Diagnosis Date  . Fibroids   . Depression   . Insomnia   . Vitamin D deficiency   . Hypertension     Past Surgical History  Procedure Laterality Date  . Tubal ligation    . [place]  . Vuvlar lesion      Family History  Problem Relation Age of Onset  . Hypertension Mother   . Hyperlipidemia Mother   . Breast cancer Maternal Grandmother   . Diabetes Maternal Grandmother   . Ovarian cancer Neg Hx   . Colon cancer Neg Hx   . Heart disease Neg Hx    Social History:  reports that she has been smoking Cigarettes.  She started smoking about 15 years ago. She has a 7.5 pack-year smoking history. She has never used smokeless tobacco. She reports that she drinks about 2.4 oz of alcohol per week. She reports that she does not use illicit drugs.  Allergies: No Known Allergies   (Not in a hospital admission)   Review of Systems  Constitutional: Positive for malaise/fatigue. Negative for fever and chills.  HENT: Negative.   Eyes: Negative.   Respiratory: Negative.   Cardiovascular: Negative.   Gastrointestinal: Positive for nausea, vomiting, abdominal pain and constipation. Negative for heartburn, diarrhea, blood in stool and melena.  Genitourinary: Negative.   Musculoskeletal: Negative.   Skin: Negative.   Neurological: Negative.   Endo/Heme/Allergies: Negative.   Psychiatric/Behavioral: Negative.      Physical  Exam:  BP 136/88 mmHg  Pulse 77  Temp(Src) 98 F (36.7 C) (Oral)  Resp 12  Ht _0  (1.651 m)  Wt 198 lb (89.812 kg)  BMI 32.95 kg/m2  SpO2 100%  LMP 09/09/2015  Physical Exam  Constitutional: She is oriented to person, place, and time and well-developed, well-nourished, and in no distress. No distress.  HENT:  Head: Normocephalic and atraumatic.  Eyes: Pupils are equal, round, and reactive to light. Right eye exhibits no discharge. Left eye exhibits no discharge. No scleral icterus.  Neck: Normal range of motion.  Cardiovascular: Normal rate, regular rhythm and normal heart sounds.   Pulmonary/Chest: Effort normal and breath sounds normal. No respiratory distress. She has no wheezes. She has no rales.  Abdominal: She exhibits distension. There is tenderness. There is no rebound and no guarding.  Ecchymosis is present Steri-Strips are present on a portion of the wound there is another small portion of wound with a packing in place with some seropurulent drainage. There is a mass in this area and tenderness without peritoneal signs  Musculoskeletal: Normal range of motion. She exhibits no edema or tenderness.  Lymphadenopathy:    She has no cervical adenopathy.  Neurological: She is alert and oriented to person, place, and time.  Skin: Skin is warm and dry. No rash noted. She is not diaphoretic. There is erythema. No pallor.  Psychiatric: Mood and affect  normal.  Vitals reviewed.       Results for orders placed or performed during the hospital encounter of 10/12/15 (from the past 48 hour(s))  Lipase, blood     Status: None   Collection Time: 10/12/15 12:42 PM  Result Value Ref Range   Lipase 16 11 - 51 U/L  Comprehensive metabolic panel     Status: Abnormal   Collection Time: 10/12/15 12:42 PM  Result Value Ref Range   Sodium 137 135 - 145 mmol/L   Potassium 3.1 (L) 3.5 - 5.1 mmol/L   Chloride 101 101 - 111 mmol/L   CO2 29 22 - 32 mmol/L   Glucose, Bld 136 (H) 65 - 99  mg/dL   BUN 27 (H) 6 - 20 mg/dL   Creatinine, Ser 1.55 (H) 0.44 - 1.00 mg/dL   Calcium 8.8 (L) 8.9 - 10.3 mg/dL   Total Protein 8.1 6.5 - 8.1 g/dL   Albumin 4.3 3.5 - 5.0 g/dL   AST 16 15 - 41 U/L   ALT 12 (L) 14 - 54 U/L   Alkaline Phosphatase 68 38 - 126 U/L   Total Bilirubin 0.8 0.3 - 1.2 mg/dL   GFR calc non Af Amer 41 (L) >60 mL/min   GFR calc Af Amer 48 (L) >60 mL/min    Comment: (NOTE) The eGFR has been calculated using the CKD EPI equation. This calculation has not been validated in all clinical situations. eGFR's persistently <60 mL/min signify possible Chronic Kidney Disease.    Anion gap 7 5 - 15  CBC     Status: Abnormal   Collection Time: 10/12/15 12:42 PM  Result Value Ref Range   WBC 14.5 (H) 3.6 - 11.0 K/uL   RBC 4.50 3.80 - 5.20 MIL/uL   Hemoglobin 13.6 12.0 - 16.0 g/dL   HCT 40.3 35.0 - 47.0 %   MCV 89.5 80.0 - 100.0 fL   MCH 30.2 26.0 - 34.0 pg   MCHC 33.8 32.0 - 36.0 g/dL   RDW 13.9 11.5 - 14.5 %   Platelets 460 (H) 150 - 440 K/uL  Urinalysis complete, with microscopic ([facility] only)     Status: Abnormal   Collection Time: 10/12/15 12:42 PM  Result Value Ref Range   Color, Urine AMBER (A) YELLOW   APPearance CLOUDY (A) CLEAR   Glucose, UA NEGATIVE NEGATIVE mg/dL   Bilirubin Urine 1+ (A) NEGATIVE   Ketones, ur NEGATIVE NEGATIVE mg/dL   Specific Gravity, Urine 1.035 (H) 1.005 - 1.030   Hgb urine dipstick 2+ (A) NEGATIVE   pH 5.0 5.0 - 8.0   Protein, ur 100 (A) NEGATIVE mg/dL   Nitrite NEGATIVE NEGATIVE   Leukocytes, UA 2+ (A) NEGATIVE   RBC / HPF 6-30 0 - 5 RBC/hpf   WBC, UA TOO NUMEROUS TO COUNT 0 - 5 WBC/hpf   Bacteria, UA MANY (A) NONE SEEN   Squamous Epithelial / LPF 6-30 (A) NONE SEEN   Mucous PRESENT    Hyaline Casts, UA PRESENT    Ct Abdomen Pelvis W Contrast  10/12/2015  CLINICAL DATA:  Abdominal pain, nausea, vomiting. Recent hysterectomy. EXAM: CT ABDOMEN AND PELVIS WITH CONTRAST TECHNIQUE: Multidetector CT imaging of the abdomen and  pelvis was performed using the standard protocol following bolus administration of intravenous contrast. CONTRAST:  100 cc Omnipaque 300 IV COMPARISON:  None. FINDINGS: Rounded soft tissue density noted peripherally in the right lung base measuring 19 mm on image 14. Triangular density peripherally at the left lung base  likely reflects scarring. No pleural effusions. Heart is normal size. Mild diffuse fatty infiltration of the liver. No focal abnormality. Gallbladder, spleen, pancreas, left adrenal and kidneys are normal. Small nodule in the right adrenal gland measures 14 mm and is nonspecific but likely reflects a small adenoma. There are dilated small bowel loops with air-fluid levels. There is an umbilical hernia containing a knuckle of small bowel, likely the cause of the bowel obstruction. In addition, the bowel within the hernia is thick walled as well as bowel just proximal and distal to the hernia. This is concerning for incarceration and at risk bowel. The colon is decompressed. There is a small amount of free fluid in the left paracolic gutter and within the mesentery. Small scattered mesenteric lymph nodes, none pathologically enlarged. Aorta is normal caliber. Prior hysterectomy. No adnexal masses. Urinary bladder is unremarkable. No acute bony abnormality or focal bone lesion. IMPRESSION: Umbilical hernia containing small bowel loop with small bowel obstruction. In addition, the herniated bowel is thick walled as is bowel just proximal to and distal to the hernia. This is concerning for incarceration and at-risk bowel. 19 mm peripheral nodule at the right lung base. This warrants follow-up with repeat chest CT in 6 months to assess for stability or change. Small right adrenal nodule, nonspecific but most likely small adenoma. Electronically Signed   By: Rolm Baptise M.D.   On: 10/12/2015 16:43     Assessment/Plan  CT scan and labs personally reviewed. This a patient with an incarcerated ventral  hernia following an open hysterectomy. She denies any other surgeries prior to this. She is experienced drainage from the wound as well and a packing is in place this is all significant as to the risk of infection with this wound. Assessment the patient and multiple family members members including her mother the rationale for offering surgery tonight and the risk of delaying surgery I also discussed with them the intentional for mesh placement which is not in the best interest this patient because of the high risk of infection. Therefore the risk of recurrence of a ventral hernia repair primarily is over 50%. A biologic mesh may be necessary as well. The potential for an open wound and drainage tubes etc. were reviewed with them they understood and agreed to this plan.  Florene Glen, MD, FACS

## 2015-10-13 LAB — BASIC METABOLIC PANEL
Anion gap: 11 (ref 5–15)
BUN: 19 mg/dL (ref 6–20)
CALCIUM: 8.5 mg/dL — AB (ref 8.9–10.3)
CO2: 27 mmol/L (ref 22–32)
CREATININE: 1.22 mg/dL — AB (ref 0.44–1.00)
Chloride: 100 mmol/L — ABNORMAL LOW (ref 101–111)
GFR calc Af Amer: >60 mL/min (ref >60)
GFR, EST NON AFRICAN AMERICAN: 55 mL/min — AB (ref >60)
GLUCOSE: 142 mg/dL — AB (ref 65–99)
Potassium: 3.2 mmol/L — ABNORMAL LOW (ref 3.5–5.1)
SODIUM: 138 mmol/L (ref 135–145)

## 2015-10-13 LAB — CBC
HEMATOCRIT: 34.9 % — AB (ref 35.0–47.0)
Hemoglobin: 11.9 g/dL — ABNORMAL LOW (ref 12.0–16.0)
MCH: 30.6 pg (ref 26.0–34.0)
MCHC: 34.1 g/dL (ref 32.0–36.0)
MCV: 89.9 fL (ref 80.0–100.0)
PLATELETS: 393 10*3/uL (ref 150–440)
RBC: 3.88 MIL/uL (ref 3.80–5.20)
RDW: 13.3 % (ref 11.5–14.5)
WBC: 13.4 10*3/uL — ABNORMAL HIGH (ref 3.6–11.0)

## 2015-10-13 MED ORDER — CYCLOBENZAPRINE HCL 10 MG PO TABS
10.0000 mg | ORAL_TABLET | Freq: Three times a day (TID) | ORAL | Status: DC
  Administered 2015-10-13 – 2015-10-17 (×13): 10 mg via ORAL
  Filled 2015-10-13 (×14): qty 1

## 2015-10-13 MED ORDER — MENTHOL 3 MG MT LOZG
1.0000 | LOZENGE | OROMUCOSAL | Status: DC | PRN
  Filled 2015-10-13 (×2): qty 9

## 2015-10-13 MED ORDER — ACETAMINOPHEN 500 MG PO TABS
1000.0000 mg | ORAL_TABLET | Freq: Four times a day (QID) | ORAL | Status: DC
  Administered 2015-10-13 – 2015-10-17 (×14): 1000 mg via ORAL
  Filled 2015-10-13 (×15): qty 2

## 2015-10-13 MED ORDER — KETOROLAC TROMETHAMINE 30 MG/ML IJ SOLN
30.0000 mg | Freq: Four times a day (QID) | INTRAMUSCULAR | Status: DC
  Administered 2015-10-13 – 2015-10-17 (×17): 30 mg via INTRAVENOUS
  Filled 2015-10-13 (×18): qty 1

## 2015-10-13 NOTE — Progress Notes (Signed)
40  Yr old female 2 weeks out from Hysterectomy with Incisional incarcerated hernia, POD#1 from Ex lap and repair of hernia, no bowel resection required.  Patient states pain well controlled.  She denies having any flatus.  She states that she still feel bloated.   Filed Vitals:   10/13/15 0441 10/13/15 1332  BP: 127/79 126/83  Pulse: 74 75  Temp: 98.1 F (36.7 C) 98.2 F (36.8 C)  Resp: 20 17   I/O last 3 completed shifts: In: 680 [I.V.:680] Out: 425 [Urine:400; Blood:25] Total I/O In: 1117.5 [I.V.:1117.5] Out: 200 [Urine:200]  PE:  Gen; NAD [id number], appropriately tender, incision clean, no erythema, staples in place with penrose superior and inferior, serous drainage on dressings.   Ext: 2+ pulses, no edema  CBC Latest Ref Rng 10/13/2015 10/12/2015 10/01/2015  WBC 3.6 - 11.0 K/uL 13.4(H) 14.5(H) -  Hemoglobin 12.0 - 16.0 g/dL 11.9(L) 13.6 10.3(L)  Hematocrit 35.0 - 47.0 % 34.9(L) 40.3 -  Platelets 150 - 440 K/uL 393 460(H) -   CMP Latest Ref Rng 10/13/2015 10/12/2015 02/01/2015  Glucose 65 - 99 mg/dL 142(H) 136(H) 101(H)  BUN 6 - 20 mg/dL 19 27(H) 9  Creatinine 0.44 - 1.00 mg/dL 1.22(H) 1.55(H) 0.98  Sodium 135 - 145 mmol/L 138 137 137  Potassium 3.5 - 5.1 mmol/L 3.2(L) 3.1(L) 4.3  Chloride 101 - 111 mmol/L 100(L) 101 99  CO2 22 - 32 mmol/L 27 29 24   Calcium 8.9 - 10.3 mg/dL 8.5(L) 8.8(L) 9.3  Total Protein 6.5 - 8.1 g/dL - 8.1 7.0  Total Bilirubin 0.3 - 1.2 mg/dL - 0.8 0.5  Alkaline Phos 38 - 126 U/L - 68 67  AST 15 - 41 U/L - 16 15  ALT 14 - 54 U/L - 12(L) 12    A/p: 40 yr old POD#1 from Incisional incarcerated hernia repair.  Continue NG tube, may have ice chips and sips of water Continue pain regimen Encourage ambulation

## 2015-10-13 NOTE — Anesthesia Postprocedure Evaluation (Signed)
Anesthesia Post Note  Patient: Theresa Sandoval  Procedure(s) Performed: Procedure(s) (LRB): Repair of incarcerated ventral hernia  (N/A)  Patient location during evaluation: PACU Anesthesia Type: General Level of consciousness: awake and alert Pain management: pain level controlled Vital Signs Assessment: post-procedure vital signs reviewed and stable Respiratory status: spontaneous breathing, nonlabored ventilation, respiratory function stable and patient connected to nasal cannula oxygen Cardiovascular status: blood pressure returned to baseline and stable Postop Assessment: no signs of nausea or vomiting Anesthetic complications: no    Last Vitals:  Filed Vitals:   10/12/15 2352 10/13/15 0008  BP: 131/85 130/80  Pulse: 72 72  Temp: 37.1 C 36.7 C  Resp: 11 20    Last Pain:  Filed Vitals:   10/13/15 0129  PainSc: [place]

## 2015-10-13 NOTE — Progress Notes (Signed)
OB/GYN POD #0 S/P Repair of incarcerated Ventral Hernia by Dr Burt Knack, General Surgery  following TAH Bilateral Salpingectomy 2 weeks ago .  BP 129/91 mmHg  Pulse 101  Temp(Src) 98.5 F (36.9 C)  Wt 198 lb 5 oz (89.954 kg)  LMP 09/09/2015  Rresting comfortably; NG in place. Hospital course reviewed. Appreciate General Surgery care. Will follow. [name], MD      .

## 2015-10-14 ENCOUNTER — Encounter: Payer: Self-pay | Admitting: Surgery

## 2015-10-14 ENCOUNTER — Ambulatory Visit: Payer: BLUE CROSS/BLUE SHIELD

## 2015-10-14 NOTE — Progress Notes (Signed)
Notified Dr. Adonis Huguenin that patient has blood in urine will continue to monitor no new orders received.

## 2015-10-14 NOTE — Progress Notes (Signed)
2 Days Post-Op   Subjective:  40 year old female 2 days status post open ventral hernia repair. Patient states she continues to feel better the day before. She does have abdominal pain and mild distention per her. She thinks she's been passing flatus but is unsure.  Vital signs in last 24 hours: Temp:  [98.2 F (36.8 C)-98.7 F (37.1 C)] 98.4 F (36.9 C) (03/13 0620) Pulse Rate:  [75-91] 91 (03/13 0620) Resp:  [16-20] 16 (03/13 0620) BP: (126-128)/(79-83) 128/79 mmHg (03/13 0620) SpO2:  [95 %-96 %] 96 % (03/13 0620)    Intake/Output from previous day: 03/12 0701 - 03/13 0700 In: 4587.5 [I.V.:4457.5; NG/GT:30; IV Piggyback:100] Out: 275 [Urine:275]  Gen.: No acute distress Chest: Clear to auscultation Heart: Regular rate and rhythm GI: Abdomen is soft, mildly distended, appropriately tender to palpation along the midline incision. Staples in place without evidence of erythema or purulent drainage. Penrose drain in place also without purulent drainage on the dressing.  Lab Results:  CBC  Recent Labs  10/12/15 1242 10/13/15 0548  WBC 14.5* 13.4*  HGB 13.6 11.9*  HCT 40.3 34.9*  PLT 460* 393   CMP     Component Value Date/Time   NA 138 10/13/2015 0548   NA 137 02/01/2015 1638   K 3.2* 10/13/2015 0548   CL 100* 10/13/2015 0548   CO2 27 10/13/2015 0548   GLUCOSE 142* 10/13/2015 0548   GLUCOSE 101* 02/01/2015 1638   BUN 19 10/13/2015 0548   BUN 9 02/01/2015 1638   CREATININE 1.22* 10/13/2015 0548   CALCIUM 8.5* 10/13/2015 0548   PROT 8.1 10/12/2015 1242   PROT 7.0 02/01/2015 1638   ALBUMIN 4.3 10/12/2015 1242   ALBUMIN 4.2 02/01/2015 1638   AST 16 10/12/2015 1242   ALT 12* 10/12/2015 1242   ALKPHOS 68 10/12/2015 1242   BILITOT 0.8 10/12/2015 1242   BILITOT 0.5 02/01/2015 1638   GFRNONAA 55* 10/13/2015 0548   GFRAA >60 10/13/2015 0548   PT/INR No results for input(s): LABPROT, INR in the last 72 hours.  Studies/Results: Ct Abdomen Pelvis W  Contrast  10/12/2015  CLINICAL DATA:  Abdominal pain, nausea, vomiting. Recent hysterectomy. EXAM: CT ABDOMEN AND PELVIS WITH CONTRAST TECHNIQUE: Multidetector CT imaging of the abdomen and pelvis was performed using the standard protocol following bolus administration of intravenous contrast. CONTRAST:  100 cc Omnipaque 300 IV COMPARISON:  None. FINDINGS: Rounded soft tissue density noted peripherally in the right lung base measuring 19 mm on image 14. Triangular density peripherally at the left lung base likely reflects scarring. No pleural effusions. Heart is normal size. Mild diffuse fatty infiltration of the liver. No focal abnormality. Gallbladder, spleen, pancreas, left adrenal and kidneys are normal. Small nodule in the right adrenal gland measures 14 mm and is nonspecific but likely reflects a small adenoma. There are dilated small bowel loops with air-fluid levels. There is an umbilical hernia containing a knuckle of small bowel, likely the cause of the bowel obstruction. In addition, the bowel within the hernia is thick walled as well as bowel just proximal and distal to the hernia. This is concerning for incarceration and at risk bowel. The colon is decompressed. There is a small amount of free fluid in the left paracolic gutter and within the mesentery. Small scattered mesenteric lymph nodes, none pathologically enlarged. Aorta is normal caliber. Prior hysterectomy. No adnexal masses. Urinary bladder is unremarkable. No acute bony abnormality or focal bone lesion. IMPRESSION: Umbilical hernia containing small bowel loop with small bowel  obstruction. In addition, the herniated bowel is thick walled as is bowel just proximal to and distal to the hernia. This is concerning for incarceration and at-risk bowel. 19 mm peripheral nodule at the right lung base. This warrants follow-up with repeat chest CT in 6 months to assess for stability or change. Small right adrenal nodule, nonspecific but most likely  small adenoma. Electronically Signed   By: Rolm Baptise M.D.   On: 10/12/2015 16:43    Assessment/Plan: 40 year old female status post open ventral hernia repair. NG tube removed this morning. If does well without NG tube plan to start a clear liquid diet later today. Plan to maintain on clear liquids until full evidence of return of bowel function. Encourage patient ambulate and use her incentive spirometer.   Clayburn Pert, MD FACS General Surgeon  10/14/2015

## 2015-10-14 NOTE — Progress Notes (Signed)
Bladder scan results of 57ml

## 2015-10-14 NOTE — Progress Notes (Signed)
Visited patient postop day 2. Patient doing very well and is quite comfortable she is tolerating a clear liquid diet at this point nasogastric tube having been removed. Continue current care.

## 2015-10-14 NOTE — Progress Notes (Signed)
Per Dr. Adonis Huguenin Discontinue order for continuous pulse ox as well as discontinue NG tube

## 2015-10-14 NOTE — Progress Notes (Signed)
Notified Dr. Adonis Huguenin that patient has had decreased urine output this shift compared to the amount of IV fluids that have transfused. Bladder scan results showed 56ml but I believe that this is inaccurate due to incision. Per Dr. Adonis Huguenin he will have Dr. Burt Knack come see her tonight and if urine output does not improve patient may need to have catheter placed.

## 2015-10-15 LAB — BASIC METABOLIC PANEL
ANION GAP: 5 (ref 5–15)
BUN: 12 mg/dL (ref 6–20)
CALCIUM: 7.9 mg/dL — AB (ref 8.9–10.3)
CO2: 28 mmol/L (ref 22–32)
CREATININE: 1.01 mg/dL — AB (ref 0.44–1.00)
Chloride: 102 mmol/L (ref 101–111)
GLUCOSE: 104 mg/dL — AB (ref 65–99)
Potassium: 2.5 mmol/L — CL (ref 3.5–5.1)
Sodium: 135 mmol/L (ref 135–145)

## 2015-10-15 LAB — MAGNESIUM: Magnesium: 1.7 mg/dL (ref 1.7–2.4)

## 2015-10-15 LAB — CBC
HEMATOCRIT: 28.9 % — AB (ref 35.0–47.0)
Hemoglobin: 9.8 g/dL — ABNORMAL LOW (ref 12.0–16.0)
MCH: 30.5 pg (ref 26.0–34.0)
MCHC: 33.9 g/dL (ref 32.0–36.0)
MCV: 90 fL (ref 80.0–100.0)
PLATELETS: 294 10*3/uL (ref 150–440)
RBC: 3.21 MIL/uL — ABNORMAL LOW (ref 3.80–5.20)
RDW: 13.5 % (ref 11.5–14.5)
WBC: 4.5 10*3/uL (ref 3.6–11.0)

## 2015-10-15 LAB — PHOSPHORUS: PHOSPHORUS: 3.4 mg/dL (ref 2.5–4.6)

## 2015-10-15 MED ORDER — DEXTROSE 5 % IV SOLN: 20.0000 meq | Freq: Once | INTRAVENOUS | Status: DC

## 2015-10-15 MED ORDER — POTASSIUM CHLORIDE 10 MEQ/100ML IV SOLN
10.0000 meq | INTRAVENOUS | Status: AC
  Administered 2015-10-15 (×2): 10 meq via INTRAVENOUS
  Filled 2015-10-15 (×2): qty 100

## 2015-10-15 MED ORDER — BENZONATATE 100 MG PO CAPS
100.0000 mg | ORAL_CAPSULE | Freq: Three times a day (TID) | ORAL | Status: DC | PRN
  Administered 2015-10-15 – 2015-10-16 (×3): 100 mg via ORAL
  Filled 2015-10-15 (×3): qty 1

## 2015-10-15 MED ORDER — POTASSIUM CHLORIDE CRYS ER 20 MEQ PO TBCR
20.0000 meq | EXTENDED_RELEASE_TABLET | Freq: Once | ORAL | Status: AC
  Administered 2015-10-15: 20 meq via ORAL
  Filled 2015-10-15: qty 1

## 2015-10-15 NOTE — Progress Notes (Signed)
3 Days Post-Op   Subjective:  Patient did well overnight on clear liquids after removal for NG tube yesterday. She states that her abdomen continues to improve. She has passed flatus but denies any bowel movement.  Vital signs in last 24 hours: Temp:  [98 F (36.7 C)-98.5 F (36.9 C)] 98 F (36.7 C) (03/14 0500) Pulse Rate:  [74-98] 84 (03/14 0500) Resp:  [17-18] 18 (03/14 0500) BP: (128-140)/(80-86) 128/80 mmHg (03/14 0500) SpO2:  [94 %-98 %] 98 % (03/14 0500) Last BM Date: 10/15/15  Intake/Output from previous day: 03/13 0701 - 03/14 0700 In: 2842 [I.V.:2762; NG/GT:30; IV Piggyback:50] Out: 59 [Urine:625; Emesis/NG output:50]  Physical exam: Gen.: No acute distress  chest: Clear to auscultation Heart: Regular rate and rhythm GI: Abdomen is large, soft, appropriately tender to palpation in the midline, dressing is clean dry and intact. Evidence of peritonitis or superficial infection.  Lab Results:  CBC  Recent Labs  10/13/15 0548 10/15/15 0350  WBC 13.4* 4.5  HGB 11.9* 9.8*  HCT 34.9* 28.9*  PLT 393 294   CMP     Component Value Date/Time   NA 135 10/15/2015 0350   NA 137 02/01/2015 1638   K 2.5* 10/15/2015 0350   CL 102 10/15/2015 0350   CO2 28 10/15/2015 0350   GLUCOSE 104* 10/15/2015 0350   GLUCOSE 101* 02/01/2015 1638   BUN 12 10/15/2015 0350   BUN 9 02/01/2015 1638   CREATININE 1.01* 10/15/2015 0350   CALCIUM 7.9* 10/15/2015 0350   PROT 8.1 10/12/2015 1242   PROT 7.0 02/01/2015 1638   ALBUMIN 4.3 10/12/2015 1242   ALBUMIN 4.2 02/01/2015 1638   AST 16 10/12/2015 1242   ALT 12* 10/12/2015 1242   ALKPHOS 68 10/12/2015 1242   BILITOT 0.8 10/12/2015 1242   BILITOT 0.5 02/01/2015 1638   GFRNONAA >60 10/15/2015 0350   GFRAA >60 10/15/2015 0350   PT/INR No results for input(s): LABPROT, INR in the last 72 hours.  Studies/Results: No results found.  Assessment/Plan: 40 year old female 3 days status post repair of incarcerated ventral hernia.  Encourage ambulation and incentive spirometer usage. Slowly advance diet while awaiting return of bowel function.   Clayburn Pert, MD FACS General Surgeon  10/15/2015

## 2015-10-15 NOTE — Progress Notes (Signed)
Dr. Burt Knack notified of critical K+ of 2.5; New orders written. Barbaraann Faster, RN 5:08 AM 10/15/2015

## 2015-10-16 LAB — WOUND CULTURE

## 2015-10-16 LAB — BASIC METABOLIC PANEL
Anion gap: 6 (ref 5–15)
BUN: 5 mg/dL — AB (ref 6–20)
CALCIUM: 8.2 mg/dL — AB (ref 8.9–10.3)
CO2: 28 mmol/L (ref 22–32)
CREATININE: 0.86 mg/dL (ref 0.44–1.00)
Chloride: 101 mmol/L (ref 101–111)
GFR calc non Af Amer: >60 mL/min (ref >60)
Glucose, Bld: 98 mg/dL (ref 65–99)
POTASSIUM: 2.7 mmol/L — AB (ref 3.5–5.1)
SODIUM: 135 mmol/L (ref 135–145)

## 2015-10-16 LAB — PHOSPHORUS: PHOSPHORUS: 2.7 mg/dL (ref 2.5–4.6)

## 2015-10-16 LAB — MAGNESIUM: MAGNESIUM: 1.6 mg/dL — AB (ref 1.7–2.4)

## 2015-10-16 MED ORDER — HYDRALAZINE HCL 20 MG/ML IJ SOLN: 10.0000 mg | Freq: Four times a day (QID) | INTRAMUSCULAR | Status: DC | PRN

## 2015-10-16 MED ORDER — AMLODIPINE BESYLATE 5 MG PO TABS
5.0000 mg | ORAL_TABLET | Freq: Every day | ORAL | Status: DC
  Administered 2015-10-16 – 2015-10-17 (×2): 5 mg via ORAL
  Filled 2015-10-16 (×2): qty 1

## 2015-10-16 MED ORDER — POTASSIUM CHLORIDE CRYS ER 20 MEQ PO TBCR
40.0000 meq | EXTENDED_RELEASE_TABLET | Freq: Once | ORAL | Status: AC
  Administered 2015-10-16: 40 meq via ORAL
  Filled 2015-10-16: qty 2

## 2015-10-16 MED ORDER — OXYCODONE-ACETAMINOPHEN 5-325 MG PO TABS
2.0000 | ORAL_TABLET | Freq: Four times a day (QID) | ORAL | Status: DC | PRN
  Administered 2015-10-16: 2 via ORAL
  Filled 2015-10-16: qty 2

## 2015-10-16 MED ORDER — METOPROLOL TARTRATE 1 MG/ML IV SOLN
5.0000 mg | Freq: Once | INTRAVENOUS | Status: AC
  Administered 2015-10-16: 5 mg via INTRAVENOUS
  Filled 2015-10-16: qty 5

## 2015-10-16 MED ORDER — MAGNESIUM SULFATE 2 GM/50ML IV SOLN
2.0000 g | Freq: Once | INTRAVENOUS | Status: AC
  Administered 2015-10-16: 2 g via INTRAVENOUS
  Filled 2015-10-16: qty 50

## 2015-10-16 MED ORDER — KCL IN DEXTROSE-NACL 40-5-0.45 MEQ/L-%-% IV SOLN
INTRAVENOUS | Status: DC
  Administered 2015-10-16 – 2015-10-17 (×2): via INTRAVENOUS
  Filled 2015-10-16 (×5): qty 1000

## 2015-10-16 MED ORDER — POTASSIUM CHLORIDE 10 MEQ/100ML IV SOLN
10.0000 meq | INTRAVENOUS | Status: AC
  Administered 2015-10-16 (×2): 10 meq via INTRAVENOUS
  Filled 2015-10-16 (×4): qty 100

## 2015-10-16 MED ORDER — HYDROCOD POLST-CPM POLST ER 10-8 MG/5ML PO SUER
5.0000 mL | Freq: Two times a day (BID) | ORAL | Status: DC | PRN
  Administered 2015-10-16 – 2015-10-17 (×2): 5 mL via ORAL
  Filled 2015-10-16 (×2): qty 5

## 2015-10-16 MED ORDER — HYDRALAZINE HCL 20 MG/ML IJ SOLN
10.0000 mg | Freq: Once | INTRAMUSCULAR | Status: AC
  Administered 2015-10-16: 10 mg via INTRAVENOUS
  Filled 2015-10-16: qty 1

## 2015-10-16 MED ORDER — LISINOPRIL 20 MG PO TABS: 20.0000 mg | ORAL_TABLET | Freq: Every day | ORAL | Status: DC

## 2015-10-16 NOTE — Consult Note (Signed)
[facility] NAME: Theresa Sandoval    MR#:  [id number]  DATE OF BIRTH:  Dec 21, 1975  DATE OF ADMISSION:  10/12/2015  PRIMARY CARE PHYSICIAN: Loistine Chance, MD   CONSULT REQUESTING/REFERRING PHYSICIAN: Dr. Adonis Huguenin  REASON FOR CONSULT:  hypertension   CHIEF COMPLAINT:   Chief Complaint  Patient presents with  . Abdominal Pain  . Emesis    HISTORY OF PRESENT ILLNESS:  Theresa Sandoval  is a 40 y.o. female with a known history of Hypertension, recent winter and hernia repair has been noticed to have blood pressure up to 180 /116. Patient was on lisinopril recently which she stopped after having increasing cough. She had elevated blood pressure in the past which resolved without medications aspirin her primary care physician's notes. No hematuria or headache or chest pain. No history of CAD or diabetes.   she has received 1 dose of IV hydralazine.  PAST MEDICAL HISTORY:   Past Medical History  Diagnosis Date  . Fibroids   . Depression   . Insomnia   . Vitamin D deficiency   . Hypertension     PAST SURGICAL HISTOIRY:   Past Surgical History  Procedure Laterality Date  . Tubal ligation    . [place]  . Vuvlar lesion    . Ventral hernia repair N/A 10/12/2015    Procedure: Repair of incarcerated ventral hernia ;  Surgeon: Florene Glen, MD;  Location: [facility] ORS;  Service: General;  Laterality: N/A;    SOCIAL HISTORY:   Social History  Substance Use Topics  . Smoking status: Current Every Day Smoker -- 0.50 packs/day for 15 years    Types: Cigarettes    Start date: 02/01/2000  . Smokeless tobacco: Never Used  . Alcohol Use: 2.4 oz/week    4 Standard drinks or equivalent per week     Comment: wine/beer     FAMILY HISTORY:   Family History  Problem Relation Age of Onset  . Hypertension Mother   . Hyperlipidemia Mother   . Breast cancer Maternal Grandmother   . Diabetes Maternal  Grandmother   . Ovarian cancer Neg Hx   . Colon cancer Neg Hx   . Heart disease Neg Hx     DRUG ALLERGIES:  No Known Allergies  REVIEW OF SYSTEMS:   ROS  CONSTITUTIONAL: No fever, fatigue or weakness.  EYES: No blurred or double vision.  EARS, NOSE, AND THROAT: No tinnitus or ear pain.  RESPIRATORY: No cough, shortness of breath, wheezing or hemoptysis.  CARDIOVASCULAR: No chest pain, orthopnea, edema.  GASTROINTESTINAL: Abdominal pain present GENITOURINARY: No dysuria, hematuria.  ENDOCRINE: No polyuria, nocturia,  HEMATOLOGY: No anemia, easy bruising or bleeding SKIN: No rash or lesion. MUSCULOSKELETAL: No joint pain or arthritis.   NEUROLOGIC: No tingling, numbness, weakness.  PSYCHIATRY: No anxiety or depression.   MEDICATIONS AT HOME:   Prior to Admission medications   Medication Sig Start Date End Date Taking? Authorizing Provider  buPROPion (WELLBUTRIN XL) 150 MG 24 hr tablet Take 1 tablet (150 mg total) by mouth daily. 03/15/15  Yes Steele Sizer, MD  citalopram (CELEXA) 20 MG tablet Take 1 tablet (20 mg total) by mouth daily. 03/15/15  Yes Steele Sizer, MD  ibuprofen (ADVIL,MOTRIN) 800 MG tablet Take 1 tablet (800 mg total) by mouth 3 (three) times daily. 10/02/15  Yes Alanda Slim Defrancesco, MD  ondansetron (ZOFRAN) 4 MG tablet Take 1 tablet (4  mg total) by mouth every 8 (eight) hours as needed for nausea or vomiting. 10/11/15  Yes Alanda Slim Defrancesco, MD  Vitamin D, Ergocalciferol, (DRISDOL) 50000 UNITS CAPS capsule Take 1 capsule (50,000 Units total) by mouth every 7 (seven) days. 02/05/15  Yes Steele Sizer, MD      VITAL SIGNS:  Blood pressure 162/102, pulse 75, temperature 97.9 F (36.6 C), temperature source Oral, resp. rate 18, height 5\' 5"  (1.651 m), weight 95.754 kg (211 lb 1.6 oz), last menstrual period 09/09/2015, SpO2 100 %.  PHYSICAL EXAMINATION:  GENERAL:  40 y.o.-year-old patient lying in the bed with no acute distress. Obese EYES: Pupils equal,  round, reactive to light and accommodation. No scleral icterus. Extraocular muscles intact.  HEENT: Head atraumatic, normocephalic. Oropharynx and nasopharynx clear.  NECK:  Supple, no jugular venous distention. No thyroid enlargement, no tenderness.  LUNGS: Normal breath sounds bilaterally, no wheezing, rales,rhonchi or crepitation. No use of accessory muscles of respiration.  CARDIOVASCULAR: S1, S2 normal. No murmurs, rubs, or gallops.  ABDOMEN: Soft. Bowel sounds present. Abdominal binder surgical site. EXTREMITIES: No pedal edema, cyanosis, or clubbing.  NEUROLOGIC: Cranial nerves II through XII are intact. Muscle strength 5/5 in all extremities. Sensation intact. Gait not checked.  PSYCHIATRIC: The patient is alert and oriented x 3.  SKIN: No obvious rash, lesion, or ulcer.   LABORATORY PANEL:   CBC  Recent Labs Lab 10/15/15 0350  WBC 4.5  HGB 9.8*  HCT 28.9*  PLT 294   ------------------------------------------------------------------------------------------------------------------  Chemistries   Recent Labs Lab 10/12/15 1242  10/16/15 0505  NA 137  < > 135  K 3.1*  < > 2.7*  CL 101  < > 101  CO2 29  < > 28  GLUCOSE 136*  < > 98  BUN 27*  < > 5*  CREATININE 1.55*  < > 0.86  CALCIUM 8.8*  < > 8.2*  MG  --   < > 1.6*  AST 16  --   --   ALT 12*  --   --   ALKPHOS 68  --   --   BILITOT 0.8  --   --   < > = values in this interval not displayed. ------------------------------------------------------------------------------------------------------------------  Cardiac Enzymes No results for input(s): TROPONINI in the last 168 hours. ------------------------------------------------------------------------------------------------------------------  RADIOLOGY:  No results found.  EKG:   Orders placed or performed in visit on 02/01/15  . EKG 12-Lead    IMPRESSION AND PLAN:   * HTN Start Norvasc 5 mg. Use IV when necessary hydralazine. Patient had  significant cough with lisinopril recently and had stopped the medication.  * Hypokalemia Due to decreased oral intake. Replace through IV and by mouth.  * Ventral hernia repair Improving well.  * DVT prophylaxis with SCDs   All the records are reviewed and case discussed with Consulting provider. Management plans discussed with the patient, family and they are in agreement.  TOTAL TIME TAKING CARE OF THIS PATIENT: 40 minutes.   Hillary Bow R M.D on 10/16/2015 at 2:23 PM  Between 7am to 6pm - Pager - [phone]  After 6pm go to [email or web address] - password [email or web address] [facility]  Office  [phone]  CC: Primary care Physician: Loistine Chance, MD  Note: This dictation was prepared with Dragon dictation along with smaller phrase technology. Any transcriptional errors that result from this process are unintentional.

## 2015-10-16 NOTE — Progress Notes (Signed)
Notified Dr.Ely of critical potassium of 2.8.  Acknowledged and no new orders

## 2015-10-16 NOTE — Progress Notes (Signed)
Notified Dr.Ely of pt elevated b/p. Acknowledged and no new orders.

## 2015-10-16 NOTE — Progress Notes (Signed)
4 Days Post-Op   Subjective:  Patient reports that she had a good night. Tolerating her diet and having bowel function. Continues to have some abdominal pain, significant cough and hypertension.  Vital signs in last 24 hours: Temp:  [97.9 F (36.6 C)-98.4 F (36.9 C)] 97.9 F (36.6 C) (03/15 0501) Pulse Rate:  [71-78] 75 (03/15 0952) Resp:  [18] 18 (03/15 0501) BP: (162-188)/(93-119) 162/102 mmHg (03/15 0952) SpO2:  [98 %-100 %] 100 % (03/15 0501) Last BM Date: 10/15/15  Intake/Output from previous day: 03/14 0701 - 03/15 0700 In: 2234 [P.O.:870; I.V.:1199; IV Piggyback:165] Out: 89 [Urine:4700; Stool:2]  Physical exam: Gen.: No acute distress  chest: Clear to auscultation Heart: Regular rate and rhythm GI: Abdomen is large, soft, appropriately tender to palpation in the midline. Staples well approximated over Penrose drain is draining a serous and was fluid. No evidence of spreading erythema or purulence.  Lab Results:  CBC  Recent Labs  10/15/15 0350  WBC 4.5  HGB 9.8*  HCT 28.9*  PLT 294   CMP     Component Value Date/Time   NA 135 10/16/2015 0505   NA 137 02/01/2015 1638   K 2.7* 10/16/2015 0505   CL 101 10/16/2015 0505   CO2 28 10/16/2015 0505   GLUCOSE 98 10/16/2015 0505   GLUCOSE 101* 02/01/2015 1638   BUN 5* 10/16/2015 0505   BUN 9 02/01/2015 1638   CREATININE 0.86 10/16/2015 0505   CALCIUM 8.2* 10/16/2015 0505   PROT 8.1 10/12/2015 1242   PROT 7.0 02/01/2015 1638   ALBUMIN 4.3 10/12/2015 1242   ALBUMIN 4.2 02/01/2015 1638   AST 16 10/12/2015 1242   ALT 12* 10/12/2015 1242   ALKPHOS 68 10/12/2015 1242   BILITOT 0.8 10/12/2015 1242   BILITOT 0.5 02/01/2015 1638   GFRNONAA >60 10/16/2015 0505   GFRAA >60 10/16/2015 0505   PT/INR No results for input(s): LABPROT, INR in the last 72 hours.  Studies/Results: No results found.  Assessment/Plan: 40 year old female 4 days status post incarcerated ventral hernia repair. Appreciate internal  medicine assistance with her hypertension. Plan for possible discharge home tomorrow. Encourage ambulation, incentive spirometer usage, oral intake.   Clayburn Pert, MD FACS General Surgeon  10/16/2015

## 2015-10-17 LAB — BASIC METABOLIC PANEL
Anion gap: 7 (ref 5–15)
BUN: 7 mg/dL (ref 6–20)
CO2: 24 mmol/L (ref 22–32)
Calcium: 8.5 mg/dL — ABNORMAL LOW (ref 8.9–10.3)
Chloride: 100 mmol/L — ABNORMAL LOW (ref 101–111)
Creatinine, Ser: 0.89 mg/dL (ref 0.44–1.00)
GFR calc Af Amer: >60 mL/min (ref >60)
GLUCOSE: 102 mg/dL — AB (ref 65–99)
POTASSIUM: 3.5 mmol/L (ref 3.5–5.1)
Sodium: 131 mmol/L — ABNORMAL LOW (ref 135–145)

## 2015-10-17 MED ORDER — OXYCODONE-ACETAMINOPHEN 5-325 MG PO TABS: 2.0000 | ORAL_TABLET | Freq: Four times a day (QID) | ORAL | Status: DC | PRN

## 2015-10-17 MED ORDER — AMLODIPINE BESYLATE 5 MG PO TABS: 5.0000 mg | ORAL_TABLET | Freq: Every day | ORAL | Status: DC

## 2015-10-17 MED ORDER — BENZONATATE 100 MG PO CAPS: 100.0000 mg | ORAL_CAPSULE | Freq: Three times a day (TID) | ORAL | Status: DC | PRN

## 2015-10-17 NOTE — Final Progress Note (Signed)
5 Days Post-Op   Subjective:  Patient had uneventful night. Tolerated regular diet. Has no active complaints.  Vital signs in last 24 hours: Temp:  [97.5 F (36.4 C)-98 F (36.7 C)] 97.5 F (36.4 C) (03/16 0523) Pulse Rate:  [70-79] 70 (03/16 0523) Resp:  [18-20] 20 (03/16 0523) BP: (155-165)/(95) 155/95 mmHg (03/16 0523) SpO2:  [98 %-99 %] 99 % (03/16 0523) Last BM Date: 10/16/15 (pt states she had a BM yesterday.)  Intake/Output from previous day: 03/15 0701 - 03/16 0700 In: 3033.3 [P.O.:480; I.V.:2483.3; IV Piggyback:70] Out: -   GI: Abdomen soft, nontender, nondistended. Midline staples well approximated without evidence of erythema or drainage. Penrose drain removed without difficulty.  Lab Results:  CBC  Recent Labs  10/15/15 0350  WBC 4.5  HGB 9.8*  HCT 28.9*  PLT 294   CMP     Component Value Date/Time   NA 131* 10/17/2015 0445   NA 137 02/01/2015 1638   K 3.5 10/17/2015 0445   CL 100* 10/17/2015 0445   CO2 24 10/17/2015 0445   GLUCOSE 102* 10/17/2015 0445   GLUCOSE 101* 02/01/2015 1638   BUN 7 10/17/2015 0445   BUN 9 02/01/2015 1638   CREATININE 0.89 10/17/2015 0445   CALCIUM 8.5* 10/17/2015 0445   PROT 8.1 10/12/2015 1242   PROT 7.0 02/01/2015 1638   ALBUMIN 4.3 10/12/2015 1242   ALBUMIN 4.2 02/01/2015 1638   AST 16 10/12/2015 1242   ALT 12* 10/12/2015 1242   ALKPHOS 68 10/12/2015 1242   BILITOT 0.8 10/12/2015 1242   BILITOT 0.5 02/01/2015 1638   GFRNONAA >60 10/17/2015 0445   GFRAA >60 10/17/2015 0445   PT/INR No results for input(s): LABPROT, INR in the last 72 hours.  Studies/Results: No results found.  Assessment/Plan: 40 year old female status post open incisional hernia repair. Doing well. Plan to discharge home today with follow-up in the surgery clinic next week for staple removal.   Clayburn Pert, MD [facility] General Surgeon  10/17/2015

## 2015-10-17 NOTE — Discharge Summary (Signed)
Patient ID: Theresa Sandoval MRN: [id number] DOB/AGE: [birth year] 40 y.o.  Admit date: 10/12/2015 Discharge date: 10/17/2015  Discharge Diagnoses:  Incisional hernia  Procedures Performed: Open incisional hernia repair  Discharged Condition: good  Hospital Course: Patient taken to the operating room from the emergency department with an incarcerated incisional hernia. She underwent open repair without difficulty. Her postoperative course was, carried by hypertension which required starting a new antihypertensive and a medical consult. Otherwise she recovered well and was able tolerate a regular diet, ambulate, only requiring oral medications prior to discharge.  Discharge Orders:  discharge home, keep incision area covered until all drainage stops.  Disposition: 01-Home or Self Care  Discharge Medications:    Medication List    TAKE these medications        amLODipine 5 MG tablet  Commonly known as:  NORVASC  Take 1 tablet (5 mg total) by mouth daily.     benzonatate 100 MG capsule  Commonly known as:  TESSALON  Take 1 capsule (100 mg total) by mouth 3 (three) times daily as needed for cough.     buPROPion 150 MG 24 hr tablet  Commonly known as:  WELLBUTRIN XL  Take 1 tablet (150 mg total) by mouth daily.     citalopram 20 MG tablet  Commonly known as:  CELEXA  Take 1 tablet (20 mg total) by mouth daily.     ibuprofen 800 MG tablet  Commonly known as:  ADVIL,MOTRIN  Take 1 tablet (800 mg total) by mouth 3 (three) times daily.     ondansetron 4 MG tablet  Commonly known as:  ZOFRAN  Take 1 tablet (4 mg total) by mouth every 8 (eight) hours as needed for nausea or vomiting.     oxyCODONE-acetaminophen 5-325 MG tablet  Commonly known as:  PERCOCET/ROXICET  Take 2 tablets by mouth every 6 (six) hours as needed for moderate pain.     Vitamin D (Ergocalciferol) 50000 units Caps capsule  Commonly known as:  DRISDOL  Take 1 capsule (50,000 Units total) by mouth every 7  (seven) days.         Follwup: Follow-up Information    Follow up with [facility]. Schedule an appointment as soon as possible for a visit in 1 week.   Specialty:  General Surgery   Why:  wound check   Contact information:   [place] [phone]      Signed: Clayburn Pert 10/17/2015, 11:40 AM

## 2015-10-17 NOTE — Progress Notes (Signed)
Pt d/c to home today.  IV removed intact.  Rx's given to pt w/all questions and concerns addressed.  D/C paperwork reviewed and education provided with all questions and concerns addressed.  Pt sister at bedside for home transport.  Volunteer services contacted for transportation from room to exit.

## 2015-10-17 NOTE — Progress Notes (Signed)
[facility] NAME: Theresa Sandoval    MR#:  [id number]  DATE OF BIRTH:  1975/10/04  SUBJECTIVE:  CHIEF COMPLAINT:   Chief Complaint  Patient presents with  . Abdominal Pain  . Emesis   Afebrile. Tolerating diet. No nausea. Mild abdominal pain. Blood pressure improving. REVIEW OF SYSTEMS:    Review of Systems  Constitutional: Negative for fever and chills.  HENT: Negative for sore throat.   Eyes: Negative for blurred vision, double vision and pain.  Respiratory: Negative for cough, hemoptysis, shortness of breath and wheezing.   Cardiovascular: Negative for chest pain, palpitations, orthopnea and leg swelling.  Gastrointestinal: Positive for abdominal pain. Negative for heartburn, nausea, vomiting, diarrhea and constipation.  Genitourinary: Negative for dysuria and hematuria.  Musculoskeletal: Negative for back pain and joint pain.  Skin: Negative for rash.  Neurological: Negative for sensory change, speech change, focal weakness and headaches.  Endo/Heme/Allergies: Does not bruise/bleed easily.  Psychiatric/Behavioral: Negative for depression. The patient is not nervous/anxious.     DRUG ALLERGIES:  No Known Allergies  VITALS:  Blood pressure 155/95, pulse 70, temperature 97.5 F (36.4 C), temperature source Oral, resp. rate 20, height 5\' 5"  (1.651 m), weight 95.754 kg (211 lb 1.6 oz), last menstrual period 09/09/2015, SpO2 99 %.  PHYSICAL EXAMINATION:   Physical Exam  GENERAL:  40 y.o.-year-old patient lying in the bed with no acute distress.  EYES: Pupils equal, round, reactive to light and accommodation. No scleral icterus. Extraocular muscles intact.  HEENT: Head atraumatic, normocephalic. Oropharynx and nasopharynx clear.  NECK:  Supple, no jugular venous distention. No thyroid enlargement, no tenderness.  LUNGS: Normal breath sounds bilaterally, no wheezing, rales, rhonchi. No use of accessory muscles of  respiration.  CARDIOVASCULAR: S1, S2 normal. No murmurs, rubs, or gallops.  ABDOMEN: Abdominal binder over surgical wound. Drains removed.  EXTREMITIES: No cyanosis, clubbing or edema b/l.    NEUROLOGIC: Cranial nerves II through XII are intact. No focal Motor or sensory deficits b/l.   PSYCHIATRIC: The patient is alert and oriented x 3.  SKIN: No obvious rash, lesion, or ulcer.   LABORATORY PANEL:   CBC  Recent Labs Lab 10/15/15 0350  WBC 4.5  HGB 9.8*  HCT 28.9*  PLT 294   ------------------------------------------------------------------------------------------------------------------ Chemistries   Recent Labs Lab 10/12/15 1242  10/16/15 0505 10/17/15 0445  NA 137  < > 135 131*  K 3.1*  < > 2.7* 3.5  CL 101  < > 101 100*  CO2 29  < > 28 24  GLUCOSE 136*  < > 98 102*  BUN 27*  < > 5* 7  CREATININE 1.55*  < > 0.86 0.89  CALCIUM 8.8*  < > 8.2* 8.5*  MG  --   < > 1.6*  --   AST 16  --   --   --   ALT 12*  --   --   --   ALKPHOS 68  --   --   --   BILITOT 0.8  --   --   --   < > = values in this interval not displayed. ------------------------------------------------------------------------------------------------------------------  Cardiac Enzymes No results for input(s): TROPONINI in the last 168 hours. ------------------------------------------------------------------------------------------------------------------  RADIOLOGY:  No results found.   ASSESSMENT AND PLAN:   * HTN Now on Norvasc and improving. 5 mg daily at d/c  * ventral hernia repair Improved Discussed with Dr. Adonis Huguenin and being discharged later today  All  the records are reviewed and case discussed with Care Management/Social Workerr. Management plans discussed with the patient, family and they are in agreement.  CODE STATUS: FULL  DVT Prophylaxis: SCDs  TOTAL TIME TAKING CARE OF THIS PATIENT: 20 minutes.  Hillary Bow R M.D on 10/17/2015 at 3:02 PM  Between 7am to 6pm - Pager  - [phone]  After 6pm go to [email or web address] - password [email or web address] [facility]  Office  [phone]  CC: Primary care physician; Loistine Chance, MD  Note: This dictation was prepared with Dragon dictation along with smaller phrase technology. Any transcriptional errors that result from this process are unintentional.

## 2015-10-17 NOTE — Discharge Instructions (Signed)
Open Hernia Repair, Care After °Refer to this sheet in the next few weeks. These instructions provide you with information on caring for yourself after your procedure. Your health care provider may also give you more specific instructions. Your treatment has been planned according to current medical practices, but problems sometimes occur. Call your health care provider if you have any problems or questions after your procedure. °WHAT TO EXPECT AFTER THE PROCEDURE °After your procedure, it is typical to have the following: °· Pain in your abdomen, especially along your incision. You will be given pain medicines to control the pain. °· Constipation. You may be given a stool softener to help prevent this. °HOME CARE INSTRUCTIONS °· Only take over-the-counter or prescription medicines as directed by your health care provider. °· Keep the incision area dry and clean. You may wash the incision area gently with soap and water 48 hours after surgery. Gently blot or dab the incision area dry. Do not take baths, use swimming pools, or use hot tubs for 10 days or until your health care provider approves. °· Change bandages (dressings) as directed by your health care provider. °· Continue your normal diet as directed by your health care provider. Eat plenty of fruits and vegetables to help prevent constipation. °· Drink enough fluids to keep your urine clear or pale yellow. This also helps prevent constipation. °· Do not drive until your health care provider says it is okay. °· Do not lift anything heavier than 10 lb (4.5 kg) or play contact sports for 4 weeks or until your health care provider approves. °· Follow up with your health care provider as directed. Ask your health care provider when to make an appointment to have your stitches (sutures) or staples removed. °SEEK MEDICAL CARE IF: °· You have increased bleeding coming from the incision site. °· You have blood in your stool. °· You have increasing pain in the incision  area. °· You see redness or swelling in the incision area. °· You have fluid (pus) coming from the incision. °· You have a fever. °· You notice a bad smell coming from the incision area or dressing. °SEEK IMMEDIATE MEDICAL CARE IF: °· You develop a rash. °· You have chest pain or shortness of breath. °· You feel lightheaded or feel faint. °  °This information is not intended to replace advice given to you by your health care provider. Make sure you discuss any questions you have with your health care provider. °  °Document Released: 02/06/2005 Document Revised: 08/10/2014 Document Reviewed: 03/01/2013 °Elsevier Interactive Patient Education ©2016 [employer] ° °

## 2015-10-22 ENCOUNTER — Other Ambulatory Visit: Payer: Self-pay

## 2015-10-23 ENCOUNTER — Ambulatory Visit (INDEPENDENT_AMBULATORY_CARE_PROVIDER_SITE_OTHER): Payer: BLUE CROSS/BLUE SHIELD | Admitting: Surgery

## 2015-10-23 ENCOUNTER — Encounter: Payer: Self-pay | Admitting: Surgery

## 2015-10-23 VITALS — BP 172/116 | HR 98 | Temp 98.9°F | Ht 65.0 in | Wt 201.6 lb

## 2015-10-23 DIAGNOSIS — Z09 Encounter for follow-up examination after completed treatment for conditions other than malignant neoplasm: Secondary | ICD-10-CM

## 2015-10-23 NOTE — Progress Notes (Signed)
MRS. Schuelke  is following up after  A laparotomy and repair of an incarcerated ventral hernia following a recent hysterectomy. Fascia was repaired with interrupted Prolene sutures by Dr. Burt Knack. He is doing well, no fevers, no chills, she is tolerating by mouth and having bowel movements.  PE: Acute distress awake alert Abd: Incision clean dry and intact staples removed and Steri-Strips placed. No evidence of infection no evidence of recurrence  A/P doing well, discussed with the patient importance of avoiding any heavy lifting and reemphasize that there is a good chance that this may have a recurrence given that it was a primary closure. She expressed understanding. Return to clinic when necessary

## 2015-10-23 NOTE — Patient Instructions (Addendum)
Your staples have been removed today and steri-strips applied. Please keep a dressing over the 2 draining areas until they heal a little more and stop draining.  You may shower normally. Wash your abdomen once daily with soap and water then apply a new dressing after it is completely dry.  Please call with any questions or concerns.

## 2015-11-07 ENCOUNTER — Other Ambulatory Visit: Payer: Self-pay | Admitting: Family Medicine

## 2015-11-07 DIAGNOSIS — F418 Other specified anxiety disorders: Secondary | ICD-10-CM

## 2015-11-11 ENCOUNTER — Other Ambulatory Visit: Payer: Self-pay | Admitting: Family Medicine

## 2015-11-11 DIAGNOSIS — F418 Other specified anxiety disorders: Secondary | ICD-10-CM

## 2015-11-11 MED ORDER — CITALOPRAM HYDROBROMIDE 20 MG PO TABS: 20.0000 mg | ORAL_TABLET | Freq: Every day | ORAL | Status: DC

## 2015-11-12 ENCOUNTER — Encounter: Payer: Self-pay | Admitting: Family Medicine

## 2015-11-12 ENCOUNTER — Encounter: Payer: Self-pay | Admitting: Obstetrics and Gynecology

## 2015-11-12 ENCOUNTER — Ambulatory Visit (INDEPENDENT_AMBULATORY_CARE_PROVIDER_SITE_OTHER): Payer: BLUE CROSS/BLUE SHIELD | Admitting: Obstetrics and Gynecology

## 2015-11-12 VITALS — BP 138/90 | HR 81 | Ht 65.0 in | Wt 197.5 lb

## 2015-11-12 DIAGNOSIS — Z09 Encounter for follow-up examination after completed treatment for conditions other than malignant neoplasm: Secondary | ICD-10-CM

## 2015-11-12 DIAGNOSIS — Z90710 Acquired absence of both cervix and uterus: Secondary | ICD-10-CM

## 2015-11-12 DIAGNOSIS — D071 Carcinoma in situ of vulva: Secondary | ICD-10-CM

## 2015-11-12 DIAGNOSIS — D259 Leiomyoma of uterus, unspecified: Secondary | ICD-10-CM

## 2015-11-12 NOTE — Progress Notes (Signed)
Chief complaint: 1. Final postop check 2. 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 Postoperative course complicated by incarcerated ventral hernia requiring repair  PATHOLOGY:  DIAGNOSIS:  A. VULVA, RIGHT LABIA MINORA; EXCISION:  - HIGH-GRADE SQUAMOUS INTRAEPITHELIAL LESION (HSIL, CIN 3).   B. UTERUS WITH CERVIX; HYSTERECTOMY:  - CERVIX WITHOUT PATHOLOGIC CHANGES.  - SECRETORY ENDOMETRIUM.  - LEIOMYOMAS (SUBSEROSAL, INTRAMURAL, AND SUBMUCOSAL), TOTAL WEIGHT OF  UTERUS 1025 GRAMS.   BILATERAL FALLOPIAN TUBES; SALPINGECTOMY:  - PARATUBAL CYSTS.   Patient reports normal bowel and bladder function. No significant abdominal pelvic pain is ongoing at this time.  OBJECTIVE: BP 138/90 mmHg  Pulse 81  Ht 5\' 5"  (1.651 m)  Wt 197 lb 8 oz (89.585 kg)  BMI 32.87 kg/m2  LMP 09/09/2015  Pleasant well-appearing African American female in no acute distress Abdomen: Soft, nontender; midline incision from her laparotomy is well-healed without evidence of hernia Pelvic: External genitalia-normal; well healed right labia minora biopsy site BUS-normal Vagina-normal; vaginal cuff intact Cervix-surgically absent Uterus surgically absent Bimanual-vaginal cuff nontender, intact, without mass; adnexa nonpalpable and nontender  ASSESSMENT: 1. Final postop check-normal 2. Status post TAH bilateral salpingectomy for symptom management fibroids 3. 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 Status post repair of incarcerated hernia  PLAN: 1. Resume activities as tolerated 2. Return in 6 months for vulvar colposcopy  [name], MD  Note: This dictation was prepared with Dragon dictation along with smaller phrase technology. Any transcriptional errors that result from this process are unintentional.

## 2015-11-12 NOTE — Patient Instructions (Signed)
1. Resume all activities without restriction 2. Return in 6 months for vulvar colposcopy

## 2015-11-13 ENCOUNTER — Encounter: Payer: Self-pay | Admitting: Surgery

## 2015-11-13 ENCOUNTER — Other Ambulatory Visit: Payer: Self-pay | Admitting: Family Medicine

## 2015-11-25 ENCOUNTER — Other Ambulatory Visit: Payer: Self-pay | Admitting: General Surgery

## 2015-11-26 MED ORDER — AMLODIPINE BESYLATE 5 MG PO TABS: 5.0000 mg | ORAL_TABLET | Freq: Every day | ORAL | Status: DC

## 2016-01-01 ENCOUNTER — Other Ambulatory Visit: Payer: Self-pay | Admitting: Family Medicine

## 2016-01-01 ENCOUNTER — Encounter: Payer: Self-pay | Admitting: Family Medicine

## 2016-01-02 ENCOUNTER — Other Ambulatory Visit: Payer: Self-pay | Admitting: Family Medicine

## 2016-01-02 MED ORDER — METRONIDAZOLE 500 MG PO TABS: 2000.0000 mg | ORAL_TABLET | Freq: Once | ORAL | Status: DC

## 2016-05-12 ENCOUNTER — Ambulatory Visit (INDEPENDENT_AMBULATORY_CARE_PROVIDER_SITE_OTHER): Payer: Self-pay | Admitting: Obstetrics and Gynecology

## 2016-05-12 ENCOUNTER — Encounter: Payer: Self-pay | Admitting: Obstetrics and Gynecology

## 2016-05-12 VITALS — BP 168/110 | HR 85 | Ht 65.0 in | Wt 205.9 lb

## 2016-05-12 DIAGNOSIS — D071 Carcinoma in situ of vulva: Secondary | ICD-10-CM

## 2016-05-12 DIAGNOSIS — K439 Ventral hernia without obstruction or gangrene: Secondary | ICD-10-CM

## 2016-05-12 NOTE — Progress Notes (Signed)
Chief complaint: 1. History of VIN 3, status post wide local excision in February 2017 2. Status post hysterectomy and repair of ventral hernia February 2017  Patient presents for colposcopy for monitoring of history of severe vulvar dysplasia. She is status post wide local excision in the operating room in February 2017 with pathology demonstrating clear margins. Patient is status post open hysterectomy complicated by hernia with subsequent repair.  Patient is not experiencing a bulge on the right side of midline that is exacerbated with lifting and Valsalva maneuvers. She reports normal bowel function. She is not having any significant abdominal pelvic pain. She has not had any fevers chills sweats, nausea vomiting or diarrhea.  Past Medical History:  Diagnosis Date  . Depression   . Fibroids   . Hypertension   . Insomnia   . Vitamin D deficiency    Past Surgical History:  Procedure Laterality Date  . [place]  . TUBAL LIGATION    . VENTRAL HERNIA REPAIR N/A 10/12/2015   Procedure: Repair of incarcerated ventral hernia ;  Surgeon: Florene Glen, MD;  Location: [facility] ORS;  Service: General;  Laterality: N/A;  . vuvlar lesion      OBJECTIVE: BP (!) 168/110   Pulse 85   Ht 5\' 5"  (1.651 m)   Wt 205 lb 14.4 oz (93.4 kg)   LMP 09/09/2015 (Exact Date)   BMI 34.26 kg/m  Abdomen: Asymmetric bulge of abdomen with possible palpable defect consistent with ventral hernia; bulge is made worse with Valsalva.  PROCEDURE: Colposcopy of vulva Indications: VIN 3 status post wide local excision Findings: Normal Description: Patient is placed in the dorsal lithotomy position. Acetic acid soaked 4 x 4 sponges are placed topically on the vulva and introitus in standard fashion. Following 2 minutes of soaking, the 4 x 4's are removed. Colposcopy demonstrates normal-appearing labia minora and majora and introitus with distal vagina. No biopsies were taken.  ASSESSMENT: 1.  History of VIN 3 status post wide local excision 2. Normal vulvar colposcopy today 3. Possible recurrence of ventral hernia  PLAN: 1. Return in 1 year for repeat colposcopy or sooner if patient becomes symptomatic 2. Referral to general surgery for assessment of possible ventral hernia recurrence.  A total of 15 minutes were spent face-to-face with the patient during this encounter and over half of that time dealt with counseling and coordination of care.  [name], MD    Note: This dictation was prepared with Dragon dictation along with smaller phrase technology. Any transcriptional errors that result from this process are unintentional.

## 2016-05-12 NOTE — Patient Instructions (Addendum)
1. Vulvar colposcopy is normal today without evidence of dysplasia 2. Return in 1 year for repeat colposcopy of vulva or sooner if vulvar itching develops 3. Referral to Dr. Adonis Huguenin, general surgery for evaluation of possible ventral hernia

## 2016-05-20 ENCOUNTER — Encounter: Payer: Self-pay | Admitting: *Deleted

## 2016-05-20 ENCOUNTER — Ambulatory Visit (INDEPENDENT_AMBULATORY_CARE_PROVIDER_SITE_OTHER): Payer: Self-pay | Admitting: General Surgery

## 2016-05-20 ENCOUNTER — Emergency Department
Admission: EM | Admit: 2016-05-20 | Discharge: 2016-05-20 | Disposition: A | Discharge status: Home / Self Care | Payer: BLUE CROSS/BLUE SHIELD | Attending: Emergency Medicine | Admitting: Emergency Medicine

## 2016-05-20 ENCOUNTER — Telehealth: Payer: Self-pay | Admitting: Family Medicine

## 2016-05-20 ENCOUNTER — Encounter: Payer: Self-pay | Admitting: General Surgery

## 2016-05-20 ENCOUNTER — Encounter: Payer: Self-pay | Admitting: Family Medicine

## 2016-05-20 VITALS — BP 179/126 | HR 74 | Temp 98.4°F | Ht 65.0 in | Wt 204.2 lb

## 2016-05-20 DIAGNOSIS — K439 Ventral hernia without obstruction or gangrene: Secondary | ICD-10-CM

## 2016-05-20 DIAGNOSIS — Z791 Long term (current) use of non-steroidal anti-inflammatories (NSAID): Secondary | ICD-10-CM | POA: Insufficient documentation

## 2016-05-20 DIAGNOSIS — N289 Disorder of kidney and ureter, unspecified: Secondary | ICD-10-CM

## 2016-05-20 DIAGNOSIS — I10 Essential (primary) hypertension: Secondary | ICD-10-CM

## 2016-05-20 DIAGNOSIS — F17210 Nicotine dependence, cigarettes, uncomplicated: Secondary | ICD-10-CM | POA: Insufficient documentation

## 2016-05-20 DIAGNOSIS — Z79899 Other long term (current) drug therapy: Secondary | ICD-10-CM | POA: Insufficient documentation

## 2016-05-20 LAB — BASIC METABOLIC PANEL
Anion gap: 6 (ref 5–15)
BUN: 13 mg/dL (ref 6–20)
CALCIUM: 8.9 mg/dL (ref 8.9–10.3)
CO2: 25 mmol/L (ref 22–32)
Chloride: 106 mmol/L (ref 101–111)
Creatinine, Ser: 1.08 mg/dL — ABNORMAL HIGH (ref 0.44–1.00)
GFR calc Af Amer: >60 mL/min (ref >60)
GLUCOSE: 101 mg/dL — AB (ref 65–99)
Potassium: 3.7 mmol/L (ref 3.5–5.1)
SODIUM: 137 mmol/L (ref 135–145)

## 2016-05-20 LAB — CBC
HCT: 42.1 % (ref 35.0–47.0)
Hemoglobin: 14.7 g/dL (ref 12.0–16.0)
MCH: 32 pg (ref 26.0–34.0)
MCHC: 34.8 g/dL (ref 32.0–36.0)
MCV: 92 fL (ref 80.0–100.0)
PLATELETS: 242 10*3/uL (ref 150–440)
RBC: 4.58 MIL/uL (ref 3.80–5.20)
RDW: 13.7 % (ref 11.5–14.5)
WBC: 11.1 10*3/uL — AB (ref 3.6–11.0)

## 2016-05-20 MED ORDER — METOPROLOL TARTRATE 25 MG PO TABS: 25.0000 mg | ORAL_TABLET | Freq: Two times a day (BID) | ORAL | 0 refills | Status: DC

## 2016-05-20 NOTE — ED Triage Notes (Addendum)
States she was at her PCP today and her BP 179/126, states hx of HTN and states she stopped taking her meds because they made her feel sick back in march, pt denies any symptoms or pain

## 2016-05-20 NOTE — Discharge Instructions (Addendum)
Please start taking the metoprolol and measure your blood pressure once daily. Bring the record with you to your primary care physician for further evaluation and treatment.  Today your kidney function is abnormal; please drink plenty of fluid and have your regular doctor recheck this.  Return to the emergency department if you develop severe pain, lightheadedness or fainting, chest pain, shortness of breath, severe headache, or any other symptoms concerning to you.

## 2016-05-20 NOTE — ED Provider Notes (Signed)
[facility] Emergency Department Provider Note  ____________________________________________  Time seen: Approximately 5:59 PM  I have reviewed the triage vital signs and the nursing notes.   HISTORY  Chief Complaint Hypertension    HPI Theresa Sandoval is a 40 y.o. female with a history of hypertension, off her medications, presenting for hypertension. The patient has a history of abdominal hernia and went to Gen. surgery clinic today for evaluation for surgery, and her blood pressure was markedly elevated there. She was sent here for further evaluation. The patient reports she is completely asymptomatic and denies any headache, nausea or vomiting, chest pain, numbness tingling or weakness., visual changes.She stopped taking her blood pressure medication several months ago due to associated cough, and did not follow up with her primary care physician for an alternative choice.   Past Medical History:  Diagnosis Date  . Depression   . Fibroids   . Hypertension   . Insomnia   . Vitamin D deficiency     Patient Active Problem List   Diagnosis Date Noted  . Status post abdominal hysterectomy 11/12/2015  . History of ventral hernia repair 10/12/2015  . Small bowel obstruction   . S/P abdominal hysterectomy 09/30/2015  . 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 History of menorrhagia 04/10/2015  . Vulvar lesion 04/10/2015  . Prediabetes 03/15/2015  . Vitamin D deficiency 03/15/2015  . Tobacco abuse 03/15/2015  . History of uterine fibroid 02/19/2015  . Obesity (BMI 30.0-34.9) 02/01/2015  . Anxiety and depression 02/01/2015  . Insomnia 02/01/2015  . EKG, abnormal 02/01/2015  . Microalbuminuria 02/01/2015    Past Surgical History:  Procedure Laterality Date  . [place]  . TUBAL LIGATION    . VENTRAL HERNIA REPAIR N/A 10/12/2015   Procedure: Repair of incarcerated ventral hernia ;  Surgeon: Florene Glen, MD;  Location: [facility] ORS;  Service: General;  Laterality: N/A;  . vuvlar lesion      Current Outpatient Rx  . Order #: [id number] Class: Normal  . Order #: [id number] Class: Print  . Order #: [id number] Class: Print  . Order #: [id number] Class: Normal    Allergies Review of patient's allergies indicates no known allergies.  Family History  Problem Relation Age of Onset  . Hypertension Mother   . Hyperlipidemia Mother   . Breast cancer Maternal Grandmother   . Diabetes Maternal Grandmother   . Ovarian cancer Neg Hx   . Colon cancer Neg Hx   . Heart disease Neg Hx     Social History Social History  Substance Use Topics  . Smoking status: Current Every Day Smoker    Packs/day: 0.50    Years: 15.00    Types: Cigarettes    Start date: 02/01/2000  . Smokeless tobacco: Never Used  . Alcohol use 2.4 oz/week    4 Standard drinks or equivalent per week     Comment: wine/beer     Review of Systems Constitutional: No fever/chills.No lightheadedness or syncope. Positive hypertension. Eyes: No visual changes. No blurred or double vision. ENT: No sore throat. No congestion or rhinorrhea. Cardiovascular: Denies chest pain. Denies palpitations. Respiratory: Denies shortness of breath.  No cough. Gastrointestinal: No abdominal pain.  No nausea, no vomiting.  No diarrhea.  No constipation. Genitourinary: Negative for dysuria. Musculoskeletal: Negative for back pain. Skin: Negative for rash. Neurological: Negative for headaches. No focal numbness, tingling or weakness.   10-point ROS otherwise negative.  ____________________________________________  PHYSICAL EXAM:  VITAL SIGNS: ED Triage Vitals [05/20/16 1604]  Enc Vitals Group     BP (!) 182/105     Pulse Rate 78     Resp 18     Temp 97.7 F (36.5 C)     Temp Source Oral     SpO2 98 %     Weight      Height      Head Circumference      Peak Flow      Pain Score      Pain Loc      Pain Edu?      Excl. in [place]?      Constitutional: Alert and oriented. Well appearing and in no acute distress. Answers questions appropriately. Eyes: Conjunctivae are normal.  EOMI. No scleral icterus. Head: Atraumatic. Nose: No congestion/rhinnorhea. Mouth/Throat: Mucous membranes are moist.  Neck: No stridor.  Supple.   Cardiovascular: Normal rate, regular rhythm. No murmurs, rubs or gallops.  Respiratory: Normal respiratory effort.  No accessory muscle use or retractions. Lungs CTAB.  No wheezes, rales or ronchi. Gastrointestinal: Soft, nontender and nondistended.  No guarding or rebound.  No peritoneal signs. Musculoskeletal: No LE edema.  Neurologic:  A&Ox3.  Speech is clear.  Face and smile are symmetric.  EOMI.  Moves all extremities well. Skin:  Skin is warm, dry and intact. No rash noted. Psychiatric: Mood and affect are normal. Speech and behavior are normal.  Normal judgement.  ____________________________________________   LABS (all labs ordered are listed, but only abnormal results are displayed)  Labs Reviewed  CBC - Abnormal; Notable for the following:       Result Value   WBC 11.1 (*)    All other components within normal limits  BASIC METABOLIC PANEL   ____________________________________________  EKG  ED ECG REPORT I, Eula Listen, the attending physician, personally viewed and interpreted this ECG.   Date: 05/20/2016  EKG Time: 1802  Rate: 73  Rhythm: normal sinus rhythm  Axis: normal  Intervals:none  ST&T Change: Nonspecific T-wave inversions in V1, V2, V3, V4 without any ST elevation. No STEMI.  ____________________________________________  RADIOLOGY  No results found.  ____________________________________________   PROCEDURES  Procedure(s) performed: None  Procedures  Critical Care performed: No ____________________________________________   INITIAL IMPRESSION / ASSESSMENT AND PLAN / ED COURSE  Pertinent labs & imaging results that were available  during my care of the patient were reviewed by me and considered in my medical decision making (see chart for details).  40 y.o. female with a history of hypertension off her medications presenting for a symptomatically hypertension. Here, the patient's blood pressure is 182/105 with a heart rate of 78. I will initiate her on metoprolol, and have her follow-up with her primary care physician. We'll get a screening EKG as well as basic labs to evaluate her creatinine, but I anticipate that she'll be discharged home with close PMD follow-up. Return precautions were discussed.  ____________________________________________  FINAL CLINICAL IMPRESSION(S) / ED DIAGNOSES  Final diagnoses:  Hypertension, unspecified type    Clinical Course      NEW MEDICATIONS STARTED DURING THIS VISIT:  New Prescriptions   METOPROLOL TARTRATE (LOPRESSOR) 25 MG TABLET    Take 1 tablet (25 mg total) by mouth 2 (two) times daily.      Eula Listen, MD 05/20/16 [phone]

## 2016-05-20 NOTE — Progress Notes (Signed)
Outpatient Surgical Follow Up  05/20/2016  Theresa Sandoval is an 40 y.o. female.   Chief Complaint  Patient presents with  . Abdominal Pain    HPI: 40 year old female who is known to the surgery department for a previous ventral hernia repair returns to clinic for follow-up of a bulge to the right side of the prior repair. Patient reports that for the last several weeks the area of mention has been bulging with Valsalva or lifting. It is remained soft and easily reducible when she lays back and occasionally causes pain when it is most protruded. She denies any fevers, chills, nausea, vomiting, chest pain, shortness breath, diarrhea, constipation. Her previous repair was done emergently and was suture only due to the emergent nature.  Past Medical History:  Diagnosis Date  . Depression   . Fibroids   . Hypertension   . Insomnia   . Vitamin D deficiency     Past Surgical History:  Procedure Laterality Date  . [place]  . TUBAL LIGATION    . VENTRAL HERNIA REPAIR N/A 10/12/2015   Procedure: Repair of incarcerated ventral hernia ;  Surgeon: Florene Glen, MD;  Location: [facility] ORS;  Service: General;  Laterality: N/A;  . vuvlar lesion      Family History  Problem Relation Age of Onset  . Hypertension Mother   . Hyperlipidemia Mother   . Breast cancer Maternal Grandmother   . Diabetes Maternal Grandmother   . Ovarian cancer Neg Hx   . Colon cancer Neg Hx   . Heart disease Neg Hx     Social History:  reports that she has been smoking Cigarettes.  She started smoking about 16 years ago. She has a 7.50 pack-year smoking history. She has never used smokeless tobacco. She reports that she drinks about 2.4 oz of alcohol per week . She reports that she does not use drugs.  Allergies: No Known Allergies  Medications reviewed.    ROS A multipoint review of systems was completed, all pertinent positives and negatives are documented within the history of  present illness and remainder are negative.   BP (!) 179/126 (BP Location: Right Arm)   Pulse 74   Temp 98.4 F (36.9 C) (Oral)   Ht 5\' 5"  (1.651 m)   Wt 92.6 kg (204 lb 3.2 oz)   LMP 09/09/2015 (Exact Date)   BMI 33.98 kg/m   Physical Exam Gen.: No acute distress Neck: Supple and nontender Chest: Her to auscultation Heart: Regular rate and rhythm Abdomen: Soft, nontender, nondistended. Obvious hernia to the right of the midline of her lower abdomen. It is soft and easily reducible when she lays flat. Well-healed midline incision from previous repair. Extremities: Moves all extremities well.    No results found for this or any previous visit (from the past 48 hour(s)). No results found.  Assessment/Plan:  1. Ventral hernia without obstruction or gangrene 40 year old female with a recurrent ventral hernia. Had a long conversation with the patient about the signs and symptoms of incarceration or strangulation and to report to emergency department should they occur. Otherwise discussed with the patient that her blood pressures currently out of control and would preclude her from undergoing an elective surgical procedure. Discussed that she would need to have her blood pressure addressed before we even discussed an elective surgical repair. Also discussed that her cigarette usage would increase the likelihood of wound complications and/or recurrence from any additional therapies. Patient voiced understanding,  she will follow up with her primary care and have her blood pressure addressed and return to clinic to schedule surgery. Between now and then she will start using an abdominal binder that she's had from her previous surgeries.  A total of 25 minutes was used for this encounter with greater than 50% of it used for counseling and coordination of care.     Clayburn Pert, MD FACS General Surgeon  05/20/2016,4:17 PM

## 2016-05-20 NOTE — Patient Instructions (Signed)
Go immediately to Dr. Ancil Boozer office. I spoke with her nurse Tiffany and they want to see you now.  We will see you back as soon as we get your blood pressure under control and we obtain medical clearance.

## 2016-05-20 NOTE — Telephone Encounter (Signed)
AMBER FROM [facility] CALLED STATING PT WAS DOING PRE OP AND BP IN LFT ARM WAS 179/126 AND IN RT ARM WAS 174/130. ADVISED PT TO GO TO ER FOR THE PT HAS NOT BEEN SEEN BY PCP SINCE 05-21-15 AND HAS STOPPED BP MEDS ON HER OWN DUE TO COUGH PER PT. ER CHARGE NURSE WAS CALLED AND NOTIFIED PT WAS ON THE WAY AND GAVE THEM THE BP READING . ALSO THE PT WAS NOTIFIED TO GO TO THE ER.

## 2016-05-20 NOTE — ED Notes (Signed)
Pt was seen at a doctor today and her blood pressure was high. States she called her PCP and they told her to come get evaluated in the ED

## 2016-05-26 NOTE — Telephone Encounter (Signed)
LMOM for pt to call the office to schedule an appt with Dr Ancil Boozer.

## 2016-06-04 ENCOUNTER — Ambulatory Visit (INDEPENDENT_AMBULATORY_CARE_PROVIDER_SITE_OTHER): Payer: PRIVATE HEALTH INSURANCE | Admitting: Family Medicine

## 2016-06-04 ENCOUNTER — Encounter: Payer: Self-pay | Admitting: Family Medicine

## 2016-06-04 ENCOUNTER — Other Ambulatory Visit: Payer: Self-pay | Admitting: Family Medicine

## 2016-06-04 VITALS — BP 174/106 | HR 74 | Temp 98.5°F | Resp 16 | Ht 65.0 in | Wt 205.0 lb

## 2016-06-04 DIAGNOSIS — I10 Essential (primary) hypertension: Secondary | ICD-10-CM

## 2016-06-04 DIAGNOSIS — R809 Proteinuria, unspecified: Secondary | ICD-10-CM | POA: Diagnosis not present

## 2016-06-04 DIAGNOSIS — Z23 Encounter for immunization: Secondary | ICD-10-CM | POA: Diagnosis not present

## 2016-06-04 DIAGNOSIS — E559 Vitamin D deficiency, unspecified: Secondary | ICD-10-CM | POA: Diagnosis not present

## 2016-06-04 DIAGNOSIS — F320 Major depressive disorder, single episode, mild: Secondary | ICD-10-CM

## 2016-06-04 DIAGNOSIS — R7303 Prediabetes: Secondary | ICD-10-CM | POA: Diagnosis not present

## 2016-06-04 LAB — TSH: TSH: 0.79 m[IU]/L

## 2016-06-04 LAB — HEMOGLOBIN A1C
Hgb A1c MFr Bld: 5.4 % (ref <5.7)
Mean Plasma Glucose: 108 mg/dL

## 2016-06-04 LAB — POCT UA - MICROALBUMIN: MICROALBUMIN (UR) POC: 50 mg/L

## 2016-06-04 MED ORDER — CITALOPRAM HYDROBROMIDE 20 MG PO TABS: 20.0000 mg | ORAL_TABLET | Freq: Every day | ORAL | Status: AC

## 2016-06-04 MED ORDER — AMLODIPINE-VALSARTAN-HCTZ 5-160-12.5 MG PO TABS: 1.0000 | ORAL_TABLET | Freq: Every day | ORAL | 0 refills | Status: DC

## 2016-06-04 MED ORDER — METOPROLOL TARTRATE 25 MG PO TABS: 12.5000 mg | ORAL_TABLET | Freq: Two times a day (BID) | ORAL | 0 refills | Status: DC

## 2016-06-04 NOTE — Progress Notes (Signed)
Name: Theresa Sandoval   MRN: [id number]    DOB: 03/23/1976   Date:06/04/2016       Progress Note  Subjective  Chief Complaint  Chief Complaint  Patient presents with  . Hospitalization Follow-up    pt went to ED with elevated BP pt has been taking medication for 2 weeks but BP still elevated  . Flu Vaccine    HPI  Depression Mild: she has been very stressed, she has a history of depression but ran out of medication last year. She states 39 yo daughter is causing a lot of stress lately. Her father is in prison, she has mood swings, disrespectful and is struggling in school. 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 She has crying spells, decrease in appetite, feels nervous, lack of patience.   HTN uncontrolled: she went for pre-op for ventral hernia repair and bp was very high, she went to [facility] and was given metoprolol, bp is still elevated, she denies headache, chest pain or palpitation  Pre-diabetes: she has episodes of polyphagia and polydipsia, she denies polyuria   Patient Active Problem List   Diagnosis Date Noted  . Status post abdominal hysterectomy 11/12/2015  . History of ventral hernia repair 10/12/2015  . Small bowel obstruction   . S/P abdominal hysterectomy 09/30/2015  . 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 History of menorrhagia 04/10/2015  . Vulvar lesion 04/10/2015  . Prediabetes 03/15/2015  . Vitamin D deficiency 03/15/2015  . Tobacco abuse 03/15/2015  . History of uterine fibroid 02/19/2015  . Obesity (BMI 30.0-34.9) 02/01/2015  . Anxiety and depression 02/01/2015  . Insomnia 02/01/2015  . EKG, abnormal 02/01/2015  . Microalbuminuria 02/01/2015    Past Surgical History:  Procedure Laterality Date  . [place]  . TUBAL LIGATION    . VENTRAL HERNIA REPAIR N/A 10/12/2015   Procedure: Repair of incarcerated ventral hernia ;  Surgeon: Richard E Cooper, MD;  Location: [facility]  ORS;  Service: General;  Laterality: N/A;  . vuvlar lesion      Family History  Problem Relation Age of Onset  . Hypertension Mother   . Hyperlipidemia Mother   . Breast cancer Maternal Grandmother   . Diabetes Maternal Grandmother   . Ovarian cancer Neg Hx   . Colon cancer Neg Hx   . Heart disease Neg Hx     Social History   Social History  . Marital status: Single    Spouse name: N/A  . Number of children: N/A  . Years of education: N/A   Occupational History  . Not on file.   Social History Main Topics  . Smoking status: Current Every Day Smoker    Packs/day: 0.50    Years: 15.00    Types: Cigarettes    Start date: 02/01/2000  . Smokeless tobacco: Never Used  . Alcohol use 2.4 oz/week    4 Standard drinks or equivalent per week     Comment: wine/beer   . Drug use: No  . Sexual activity: Not Currently    Partners: Male    Birth control/ protection: Other-see comments     Comment: Tubal ligation   Other Topics Concern  . Not on file   Social History Narrative  . No narrative on file     Current Outpatient Prescriptions:  .  Amlodipine-Valsartan-HCTZ 5-160-12.5 MG TABS, Take 1 tablet by mouth daily with breakfast., Disp: 30   tablet, Rfl: 0 .  citalopram (CELEXA) 20 MG tablet, Take 1 tablet (20 mg total) by mouth daily., Disp: 90 tablet, Rfl: .0 .  metoprolol tartrate (LOPRESSOR) 25 MG tablet, Take 0.5 tablets (12.5 mg total) by mouth 2 (two) times daily., Disp: 60 tablet, Rfl: 0  No Known Allergies   ROS  Constitutional: Negative for fever or weight change.  Respiratory: Negative for cough and shortness of breath.   Cardiovascular: Negative for chest pain or palpitations.  Gastrointestinal: Negative for abdominal pain, no bowel changes.  Musculoskeletal: Negative for gait problem or joint swelling.  Skin: Negative for rash.  Neurological: Negative for dizziness or headache.  No other specific complaints in a complete review of systems (except as listed  in HPI above).  Objective  Vitals:   06/04/16 1118  BP: (!) 174/106  Pulse: 74  Resp: 16  Temp: 98.5 F (36.9 C)  TempSrc: Oral  SpO2: 99%  Weight: 205 lb (93 kg)  Height: 5' 5" (1.651 m)    Body mass index is 34.11 kg/m.  Physical Exam  Constitutional: Patient appears well-developed and well-nourished. Obese  No distress.  HEENT: head atraumatic, normocephalic, pupils equal and reactive to light, neck supple, throat within normal limits Cardiovascular: Normal rate, regular rhythm and normal heart sounds.  No murmur heard. No BLE edema. Pulmonary/Chest: Effort normal and breath sounds normal. No respiratory distress. Abdominal: Soft.  There is no tenderness. Psychiatric: Patient has a normal mood and affect. behavior is normal. Judgment and thought content normal.   Recent Results (from the past 2160 hour(s))  CBC     Status: Abnormal   Collection Time: 05/20/16  6:11 PM  Result Value Ref Range   WBC 11.1 (H) 3.6 - 11.0 K/uL   RBC 4.58 3.80 - 5.20 MIL/uL   Hemoglobin 14.7 12.0 - 16.0 g/dL   HCT 42.1 35.0 - 47.0 %   MCV 92.0 80.0 - 100.0 fL   MCH 32.0 26.0 - 34.0 pg   MCHC 34.8 32.0 - 36.0 g/dL   RDW 13.7 11.5 - 14.5 %   Platelets 242 150 - 440 K/uL  Basic metabolic panel     Status: Abnormal   Collection Time: 05/20/16  6:11 PM  Result Value Ref Range   Sodium 137 135 - 145 mmol/L   Potassium 3.7 3.5 - 5.1 mmol/L   Chloride 106 101 - 111 mmol/L   CO2 25 22 - 32 mmol/L   Glucose, Bld 101 (H) 65 - 99 mg/dL   BUN 13 6 - 20 mg/dL   Creatinine, Ser 1.08 (H) 0.44 - 1.00 mg/dL   Calcium 8.9 8.9 - 10.3 mg/dL   GFR calc non Af Amer >60 >60 mL/min   GFR calc Af Amer >60 >60 mL/min    Comment: (NOTE) The eGFR has been calculated using the CKD EPI equation. This calculation has not been validated in all clinical situations. eGFR's persistently <60 mL/min signify possible Chronic Kidney Disease.    Anion gap 6 5 - 15      PHQ2/9: Depression screen PHQ 2/9  06/04/2016 06/04/2016 02/01/2015 02/01/2015  Decreased Interest 1 0 3 0  Down, Depressed, Hopeless 1 1 2 0  PHQ - 2 Score 2 1 5 0  Altered sleeping 3 - 3 -  Tired, decreased energy 1 - 3 -  Change in appetite 2 - 0 -  Feeling bad or failure about yourself  1 - 0 -  Trouble concentrating 0 - 0 -  Moving   slowly or fidgety/restless 1 - 0 -  Suicidal thoughts 0 - 0 -  PHQ-9 Score 10 - 11 -  Difficult doing work/chores Very difficult - Somewhat difficult -     Fall Risk: Fall Risk  02/01/2015  Falls in the past year? No      Assessment & Plan  1. Uncontrolled hypertension  We will try stopping metoprolol, advised to wean self off and start triple therapy and check labs, reviewed labs done at [facility] and kidney function was fine - Amlodipine-Valsartan-HCTZ 5-160-12.5 MG TABS; Take 1 tablet by mouth daily with breakfast.  Dispense: 30 tablet; Refill: 0 - metoprolol tartrate (LOPRESSOR) 25 MG tablet; Take 0.5 tablets (12.5 mg total) by mouth 2 (two) times daily.  Dispense: 60 tablet; Refill: 0 - TSH - Lipid panel  2. Need for influenza vaccination  - Flu Vaccine QUAD 36+ mos PF IM (Fluarix & Fluzone Quad PF)  3. Prediabetes  - Hemoglobin A1c  4. Vitamin D deficiency  - VITAMIN D 25 Hydroxy (Vit-D Deficiency, Fractures)  5. Microalbuminuria  - POCT UA - Microalbumin  6. Mild major depression (HCC)  - citalopram (CELEXA) 20 MG tablet; Take 1 tablet (20 mg total) by mouth daily.  Dispense: 90 tablet; Refill: .0  

## 2016-06-05 ENCOUNTER — Other Ambulatory Visit: Payer: Self-pay | Admitting: Family Medicine

## 2016-06-05 LAB — LIPID PANEL
CHOL/HDL RATIO: 5.2 ratio — AB (ref <=5.0)
Cholesterol: 165 mg/dL (ref 125–200)
HDL: 32 mg/dL — AB (ref >=46)
LDL CALC: 106 mg/dL (ref <130)
TRIGLYCERIDES: 133 mg/dL (ref <150)
VLDL: 27 mg/dL (ref <30)

## 2016-06-05 LAB — VITAMIN D 25 HYDROXY (VIT D DEFICIENCY, FRACTURES): Vit D, 25-Hydroxy: 16 ng/mL — ABNORMAL LOW (ref 30–100)

## 2016-06-05 MED ORDER — VITAMIN D (ERGOCALCIFEROL) 1.25 MG (50000 UNIT) PO CAPS: 50000.0000 [IU] | ORAL_CAPSULE | ORAL | 0 refills | Status: AC

## 2016-06-05 NOTE — Telephone Encounter (Signed)
Patient requesting refill of Amlodipine-Valsart-H[facility].

## 2016-06-08 ENCOUNTER — Encounter: Payer: Self-pay | Admitting: Family Medicine

## 2016-06-18 ENCOUNTER — Other Ambulatory Visit: Payer: Self-pay | Admitting: Family Medicine

## 2016-06-18 ENCOUNTER — Ambulatory Visit (INDEPENDENT_AMBULATORY_CARE_PROVIDER_SITE_OTHER): Payer: PRIVATE HEALTH INSURANCE | Admitting: Family Medicine

## 2016-06-18 ENCOUNTER — Encounter: Payer: Self-pay | Admitting: Family Medicine

## 2016-06-18 VITALS — BP 158/98 | HR 69 | Temp 98.9°F | Resp 16 | Ht 65.0 in | Wt 205.1 lb

## 2016-06-18 DIAGNOSIS — E785 Hyperlipidemia, unspecified: Secondary | ICD-10-CM | POA: Diagnosis not present

## 2016-06-18 DIAGNOSIS — I10 Essential (primary) hypertension: Secondary | ICD-10-CM

## 2016-06-18 DIAGNOSIS — Z113 Encounter for screening for infections with a predominantly sexual mode of transmission: Secondary | ICD-10-CM | POA: Diagnosis not present

## 2016-06-18 DIAGNOSIS — R809 Proteinuria, unspecified: Secondary | ICD-10-CM | POA: Diagnosis not present

## 2016-06-18 DIAGNOSIS — F320 Major depressive disorder, single episode, mild: Secondary | ICD-10-CM | POA: Diagnosis not present

## 2016-06-18 MED ORDER — AMLODIPINE-VALSARTAN-HCTZ 10-320-25 MG PO TABS: 1.0000 | ORAL_TABLET | Freq: Every day | ORAL | 0 refills | Status: DC

## 2016-06-18 NOTE — Progress Notes (Addendum)
Name: Theresa Sandoval   MRN: [id number]    DOB: 06-08-1976   Date:06/18/2016       Progress Note  Subjective  Chief Complaint  Chief Complaint  Patient presents with  . Hypertension    pt stated that she just started medication on monday due to cost of medicne    HPI  Depression Mild: she has been very stressed, she has a history of depression but ran out of medication last year. She states 39 yo daughter is causing a lot of stress lately. Her father is in prison, she has mood swings, disrespectful and is struggling in school. 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 We started her on Citalopram 20 mg two weeks ago and she states her mood has improved, no crying spells in the past two weeks, she does not feels as tense, she has been sleeping better. No suicidal thoughts or ideation.   HTN uncontrolled: she went for pre-op for ventral hernia repair and bp was very high, she was given metoprolol by [facility], we added Exforge HCTZ two weeks ago, but she was only able to get it filled this week. Too expensive.   Pre-diabetes: she has episodes of polyphagia and polydipsia, she denies polyuria  Dyslipidemia: she has started changing her diet to increase HDL   STI Screen: she was treated for trichomonas and wants a test of cure, we will send specimen to lab today, no vaginal discharge  Patient Active Problem List   Diagnosis Date Noted  . Status post abdominal hysterectomy 11/12/2015  . History of ventral hernia repair 10/12/2015  . Small bowel obstruction   . S/P abdominal hysterectomy 09/30/2015  . 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 History of menorrhagia 04/10/2015  . Vulvar lesion 04/10/2015  . Prediabetes 03/15/2015  . Vitamin D deficiency 03/15/2015  . Tobacco abuse 03/15/2015  . History of uterine fibroid 02/19/2015  . Obesity (BMI 30.0-34.9) 02/01/2015  . Anxiety and depression 02/01/2015  . Insomnia  02/01/2015  . EKG, abnormal 02/01/2015  . Microalbuminuria 02/01/2015    Past Surgical History:  Procedure Laterality Date  . [place]  . TUBAL LIGATION    . VENTRAL HERNIA REPAIR N/A 10/12/2015   Procedure: Repair of incarcerated ventral hernia ;  Surgeon: Florene Glen, MD;  Location: [facility] ORS;  Service: General;  Laterality: N/A;  . vuvlar lesion      Family History  Problem Relation Age of Onset  . Hypertension Mother   . Hyperlipidemia Mother   . Breast cancer Maternal Grandmother   . Diabetes Maternal Grandmother   . Ovarian cancer Neg Hx   . Colon cancer Neg Hx   . Heart disease Neg Hx     Social History   Social History  . Marital status: Single    Spouse name: N/A  . Number of children: N/A  . Years of education: N/A   Occupational History  . Not on file.   Social History Main Topics  . Smoking status: Current Every Day Smoker    Packs/day: 0.50    Years: 15.00    Types: Cigarettes    Start date: 02/01/2000  . Smokeless tobacco: Never Used  . Alcohol use 2.4 oz/week    4 Standard drinks or equivalent per week     Comment: wine/beer   . Drug use: No  . Sexual activity: Not Currently  Partners: Male    Birth control/ protection: Other-see comments     Comment: Tubal ligation   Other Topics Concern  . Not on file   Social History Narrative  . No narrative on file     Current Outpatient Prescriptions:  .  Amlodipine-Valsartan-HCTZ 10-320-25 MG TABS, Take 1 tablet by mouth daily., Disp: 30 tablet, Rfl: 0 .  citalopram (CELEXA) 20 MG tablet, Take 1 tablet (20 mg total) by mouth daily., Disp: 90 tablet, Rfl: .0 .  Vitamin D, Ergocalciferol, (DRISDOL) 50000 units CAPS capsule, Take 1 capsule (50,000 Units total) by mouth every 7 (seven) days., Disp: 12 capsule, Rfl: 0  No Known Allergies   ROS  Ten systems reviewed and is negative except as mentioned in HPI   Objective  Vitals:   06/18/16 1014  BP: (!) 158/98   Pulse: 69  Resp: 16  Temp: 98.9 F (37.2 C)  SpO2: 98%  Weight: 205 lb 1 oz (93 kg)  Height: _0  (1.651 m)    Body mass index is 34.12 kg/m.  Physical Exam  Constitutional: Patient appears well-developed and well-nourished. Obese  No distress.  HEENT: head atraumatic, normocephalic, pupils equal and reactive to light, neck supple, throat within normal limits Cardiovascular: Normal rate, regular rhythm and normal heart sounds.  No murmur heard. No BLE edema. Pulmonary/Chest: Effort normal and breath sounds normal. No respiratory distress. Abdominal: Soft.  There is no tenderness. Pelvic: absent cervix, white discharge with some odor, no bimanual motion tenderness Psychiatric: Patient has a normal mood and affect. behavior is normal. Judgment and thought content normal.  Recent Results (from the past 2160 hour(s))  CBC     Status: Abnormal   Collection Time: 05/20/16  6:11 PM  Result Value Ref Range   WBC 11.1 (H) 3.6 - 11.0 K/uL   RBC 4.58 3.80 - 5.20 MIL/uL   Hemoglobin 14.7 12.0 - 16.0 g/dL   HCT 42.1 35.0 - 47.0 %   MCV 92.0 80.0 - 100.0 fL   MCH 32.0 26.0 - 34.0 pg   MCHC 34.8 32.0 - 36.0 g/dL   RDW 13.7 11.5 - 14.5 %   Platelets 242 150 - 440 K/uL  Basic metabolic panel     Status: Abnormal   Collection Time: 05/20/16  6:11 PM  Result Value Ref Range   Sodium 137 135 - 145 mmol/L   Potassium 3.7 3.5 - 5.1 mmol/L   Chloride 106 101 - 111 mmol/L   CO2 25 22 - 32 mmol/L   Glucose, Bld 101 (H) 65 - 99 mg/dL   BUN 13 6 - 20 mg/dL   Creatinine, Ser 1.08 (H) 0.44 - 1.00 mg/dL   Calcium 8.9 8.9 - 10.3 mg/dL   GFR calc non Af Amer >60 >60 mL/min   GFR calc Af Amer >60 >60 mL/min    Comment: (NOTE) The eGFR has been calculated using the CKD EPI equation. This calculation has not been validated in all clinical situations. eGFR's persistently <60 mL/min signify possible Chronic Kidney Disease.    Anion gap 6 5 - 15  POCT UA - Microalbumin     Status: Abnormal    Collection Time: 06/04/16 12:19 PM  Result Value Ref Range   Microalbumin Ur, POC 50 mg/L   Creatinine, POC  mg/dL   Albumin/Creatinine Ratio, Urine, POC    TSH     Status: None   Collection Time: 06/04/16 12:22 PM  Result Value Ref Range   TSH 0.79 mIU/L  Comment:   Reference Range   > or = 20 Years  0.40-4.50   Pregnancy Range First trimester  0.26-2.66 Second trimester 0.55-2.73 Third trimester  0.43-2.91     Hemoglobin A1c     Status: None   Collection Time: 06/04/16 12:22 PM  Result Value Ref Range   Hgb A1c MFr Bld 5.4 <5.7 %    Comment:   For the purpose of screening for the presence of diabetes:   <5.7%       Consistent with the absence of diabetes 5.7-6.4 %   Consistent with increased risk for diabetes (prediabetes) >=6.5 %     Consistent with diabetes   This assay result is consistent with a decreased risk of diabetes.   Currently, no consensus exists regarding use of hemoglobin A1c for diagnosis of diabetes in children.   According to American Diabetes Association (ADA) guidelines, hemoglobin A1c <7.0% represents optimal control in non-pregnant diabetic patients. Different metrics may apply to specific patient populations. Standards of Medical Care in Diabetes (ADA).      Mean Plasma Glucose 108 mg/dL  Lipid panel     Status: Abnormal   Collection Time: 06/04/16 12:22 PM  Result Value Ref Range   Cholesterol 165 125 - 200 mg/dL   Triglycerides 133 <150 mg/dL   HDL 32 (L) >=46 mg/dL   Total CHOL/HDL Ratio 5.2 (H) <=5.0 Ratio   VLDL 27 <30 mg/dL   LDL Cholesterol 106 <130 mg/dL    Comment:   Total Cholesterol/HDL Ratio:CHD Risk                        Coronary Heart Disease Risk Table                                        Men       Women          1/2 Average Risk              3.4        3.3              Average Risk              5.0        4.4           2X Average Risk              9.6        7.1           3X Average Risk             23.4        11.0 Use the calculated Patient Ratio above and the CHD Risk table  to determine the patient's CHD Risk.   VITAMIN D 25 Hydroxy (Vit-D Deficiency, Fractures)     Status: Abnormal   Collection Time: 06/04/16 12:22 PM  Result Value Ref Range   Vit D, 25-Hydroxy 16 (L) 30 - 100 ng/mL    Comment: Vitamin D Status           25-OH Vitamin D        Deficiency                <20 ng/mL        Insufficiency         20 - 29 ng/mL  Optimal             > or = 30 ng/mL   For 25-OH Vitamin D testing on patients on D2-supplementation and patients for whom quantitation of D2 and D3 fractions is required, the QuestAssureD 25-OH VIT D, (D2,D3), LC/MS/MS is recommended: order code [id number] (patients > 2 yrs).       PHQ2/9: Depression screen [facility] 2/9 06/04/2016 06/04/2016 02/01/2015 02/01/2015  Decreased Interest 1 0 3 0  Down, Depressed, Hopeless _0 0  PHQ - 2 Score _1 0  Altered sleeping 3 - 3 -  Tired, decreased energy 1 - 3 -  Change in appetite 2 - 0 -  Feeling bad or failure about yourself  1 - 0 -  Trouble concentrating 0 - 0 -  Moving slowly or fidgety/restless 1 - 0 -  Suicidal thoughts 0 - 0 -  PHQ-9 Score 10 - 11 -  Difficult doing work/chores Very difficult - Somewhat difficult -     Fall Risk: Fall Risk  02/01/2015  Falls in the past year? No     Assessment & Plan  1. Uncontrolled hypertension bp has improved, however not at goal, medication was $39 and she just filled it two days ago, continue current dose, stop Metoprolol and we will increase  Exforge HCTZ from 5/160/12.5 to 10-320-25, we will monitor  - Amlodipine-Valsartan-HCTZ 10-320-25 MG TABS; Take 1 tablet by mouth daily.  Dispense: 30 tablet; Refill: 0  2. Microalbuminuria  - Amlodipine-Valsartan-HCTZ 10-320-25 MG TABS; Take 1 tablet by mouth daily.  Dispense: 30 tablet; Refill: 0  3. Dyslipidemia  Lipid panel shows low HDL : to improve HDL patient  needs to eat tree nuts ( pecans/pistachios/almonds ) four  times weekly, eat fish two times weekly  and exercise  at least 150 minutes per week  4. Mild major depression ([place])  She is doing better, more calm, no side effects of medication    5. Routine screening for STI (sexually transmitted infection)  - Chlamydia/Gonococcus/Trichomonas, NAA

## 2016-06-18 NOTE — Addendum Note (Signed)
Addended by: Steele Sizer F on: 06/18/2016 11:00 AM   Modules accepted: Orders

## 2016-06-19 LAB — SPECIMEN STATUS REPORT

## 2016-06-20 LAB — VAGINITIS/VAGINOSIS, DNA PROBE
CANDIDA SPECIES: NEGATIVE
GARDNERELLA VAGINALIS: POSITIVE — AB
Trichomonas vaginosis: NEGATIVE

## 2016-06-20 LAB — CHLAMYDIA/GONOCOCCUS/TRICHOMONAS, NAA
CHLAMYDIA BY NAA: NEGATIVE
GONOCOCCUS BY NAA: NEGATIVE
TRICH VAG BY NAA: NEGATIVE

## 2016-06-20 LAB — TEST CODE CHANGE

## 2016-06-24 ENCOUNTER — Encounter: Payer: Self-pay | Admitting: Family Medicine

## 2016-06-25 ENCOUNTER — Other Ambulatory Visit: Payer: Self-pay | Admitting: Family Medicine

## 2016-06-25 MED ORDER — METRONIDAZOLE 500 MG PO TABS: ORAL_TABLET | ORAL | 0 refills | Status: AC

## 2016-07-22 ENCOUNTER — Telehealth: Payer: Self-pay

## 2016-07-22 ENCOUNTER — Ambulatory Visit: Payer: PRIVATE HEALTH INSURANCE | Admitting: Family Medicine

## 2016-07-22 NOTE — Telephone Encounter (Signed)
Patient cancelled her PCP appointment for BP check. She will need to be seen by PCP to make sure BP is within range prior to rescheduling appointment to discuss surgery further with surgeon.

## 2016-08-28 ENCOUNTER — Other Ambulatory Visit: Payer: Self-pay | Admitting: Family Medicine

## 2016-08-28 DIAGNOSIS — I10 Essential (primary) hypertension: Secondary | ICD-10-CM

## 2016-08-28 DIAGNOSIS — R809 Proteinuria, unspecified: Secondary | ICD-10-CM

## 2016-08-28 MED ORDER — AMLODIPINE-VALSARTAN-HCTZ 10-320-25 MG PO TABS: 1.0000 | ORAL_TABLET | Freq: Every day | ORAL | 0 refills | Status: AC

## 2016-09-26 IMAGING — CT CT ABD-PELV W/ CM
2 of 4 series · 15 of 46 positions shown, 17 images · IV contrast (omnipaque)
Comparison: None.

CLINICAL DATA: Abdominal pain, nausea, vomiting. Recent
hysterectomy.

EXAM:
CT ABDOMEN AND PELVIS WITH CONTRAST
TECHNIQUE: Multidetector CT imaging of the abdomen and pelvis was performed
using the standard protocol following bolus administration of
intravenous contrast.
CONTRAST:  100 cc Omnipaque 300 IV

[Series 2: routine abd pel with · axial · 0.71mm/px · z∈[-970,-550]mm · 12 of 92 slices shown, 14 images]
[im 4/92  soft-tissue]
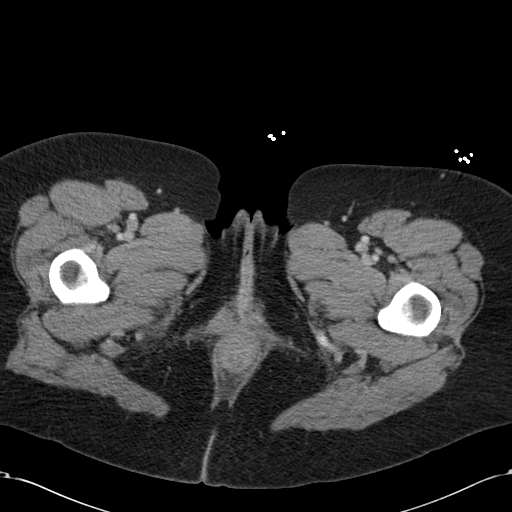
[im 4/92  bone]
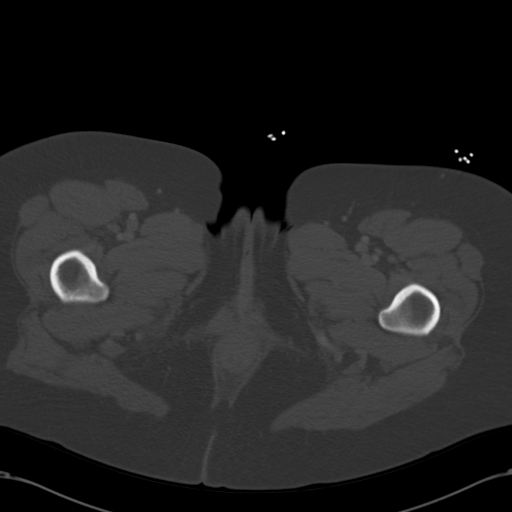
[im 12/92  soft-tissue]
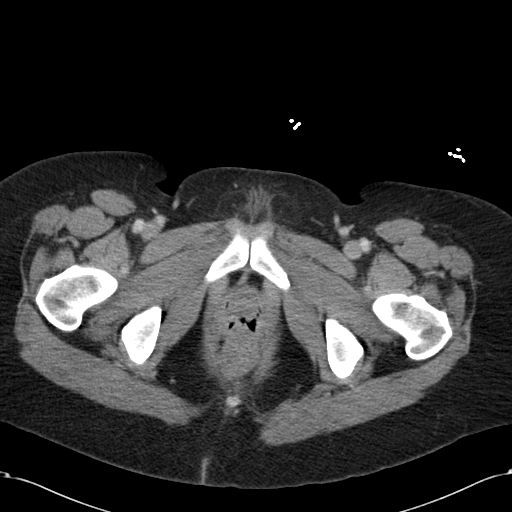
[im 19/92  soft-tissue]
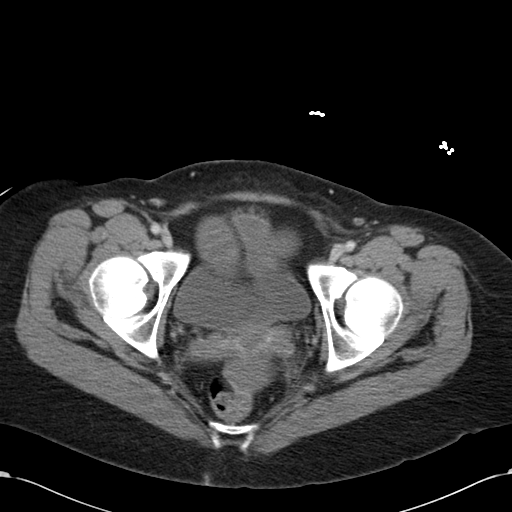
[im 27/92  soft-tissue]
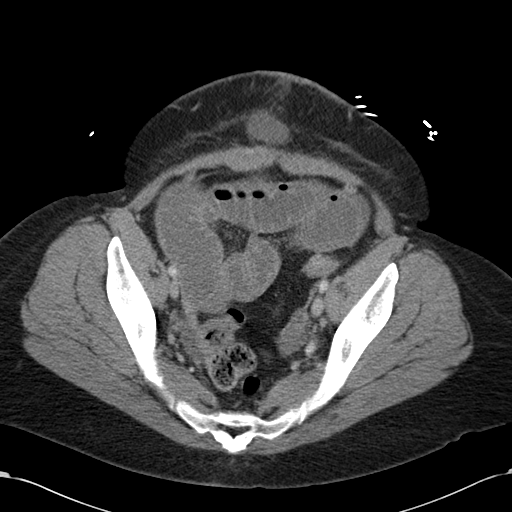
[im 35/92  soft-tissue]
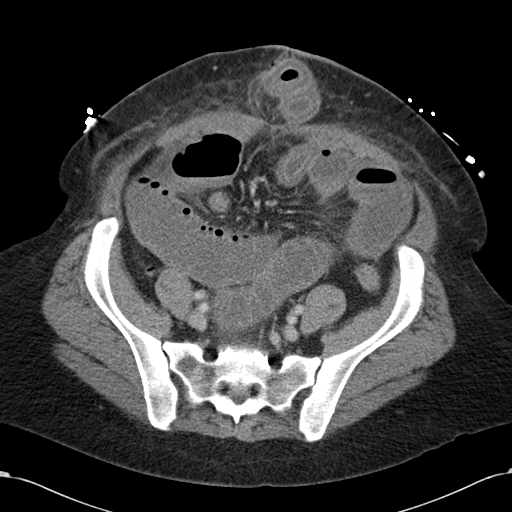
[im 42/92  soft-tissue]
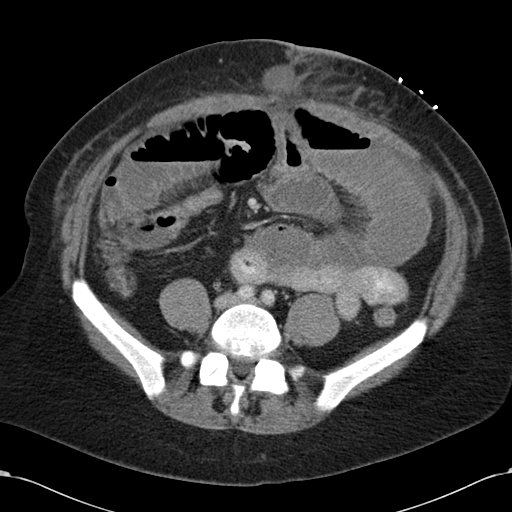
[im 50/92  soft-tissue]
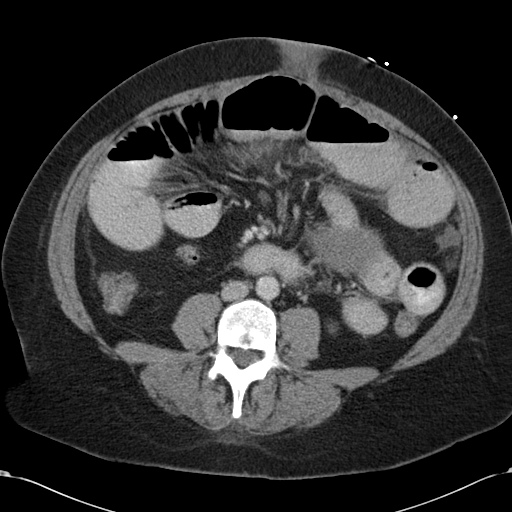
[im 57/92  soft-tissue]
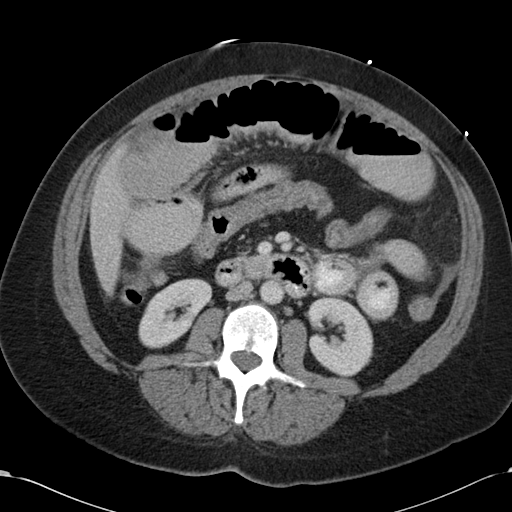
[im 65/92  soft-tissue]
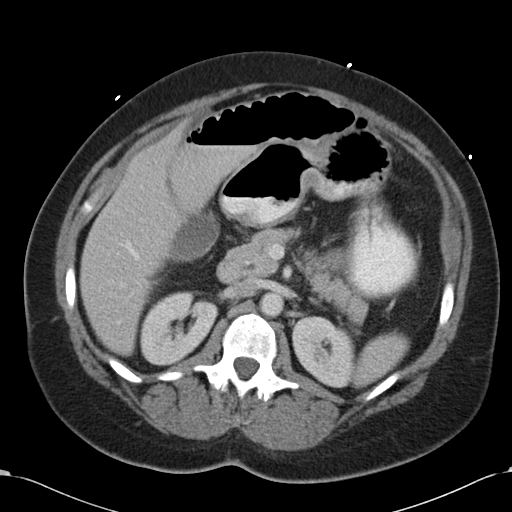
[im 65/92  bone]
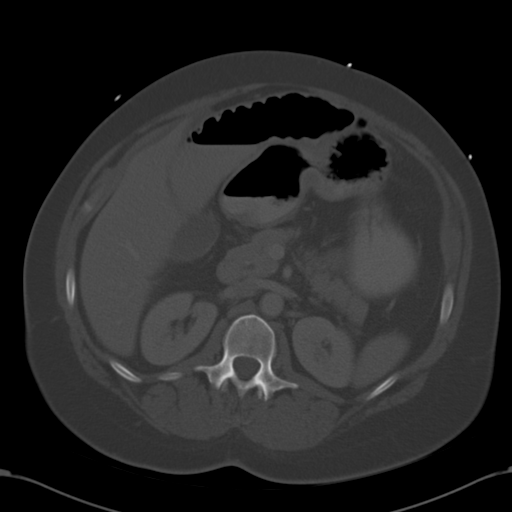
[im 73/92  soft-tissue]
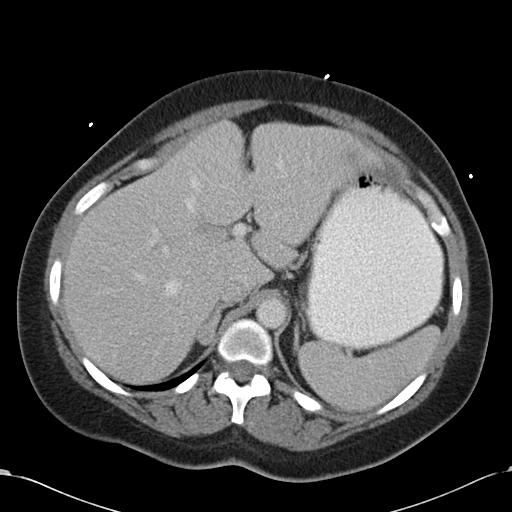
[im 80/92  soft-tissue]
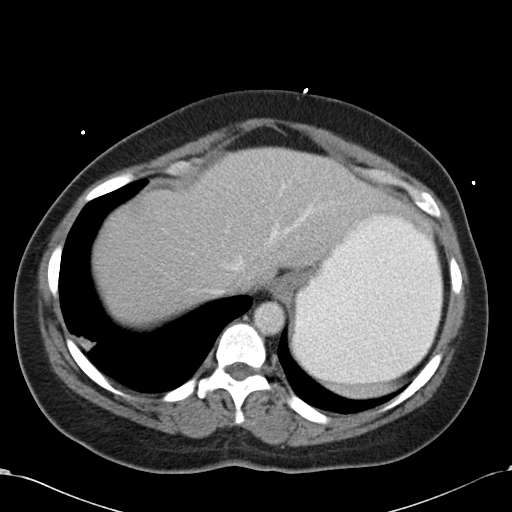
[im 88/92  soft-tissue]
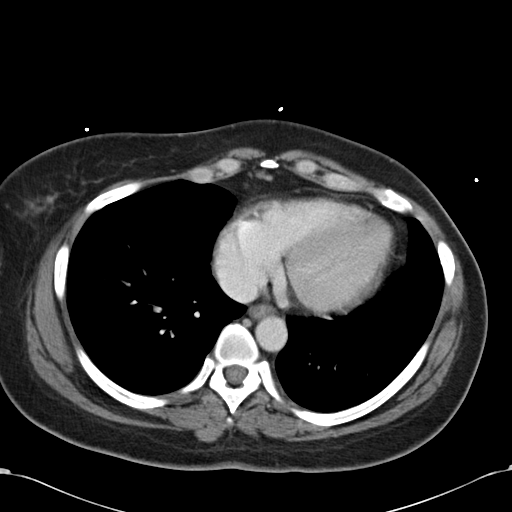

[Series 5: cor routine abd pel with · coronal · 0.59mm/px · 3 of 155 slices shown]
[im 52/155  soft-tissue]
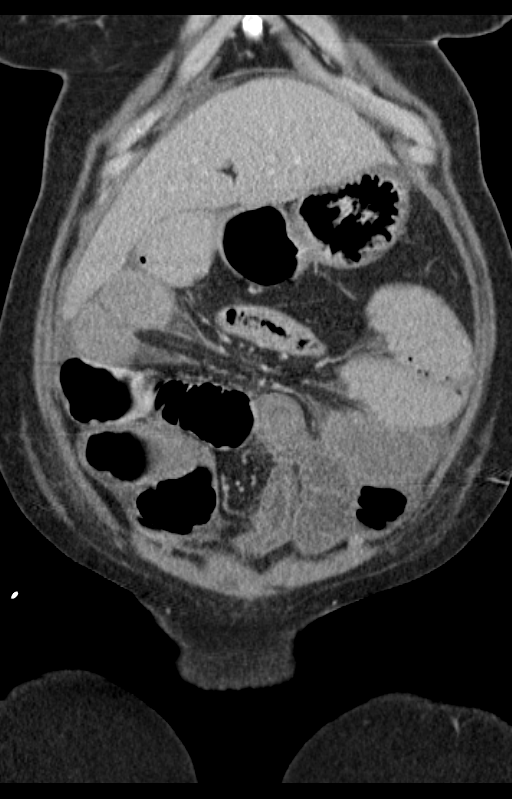
[im 69/155  soft-tissue]
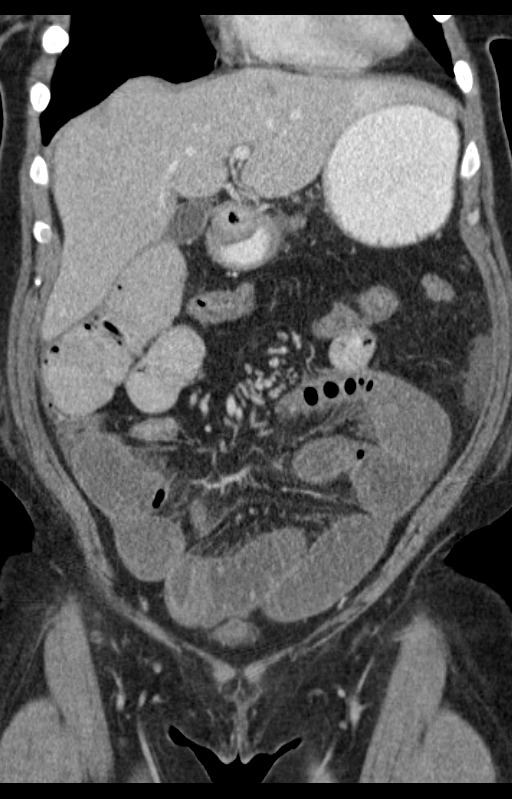
[im 86/155  soft-tissue]
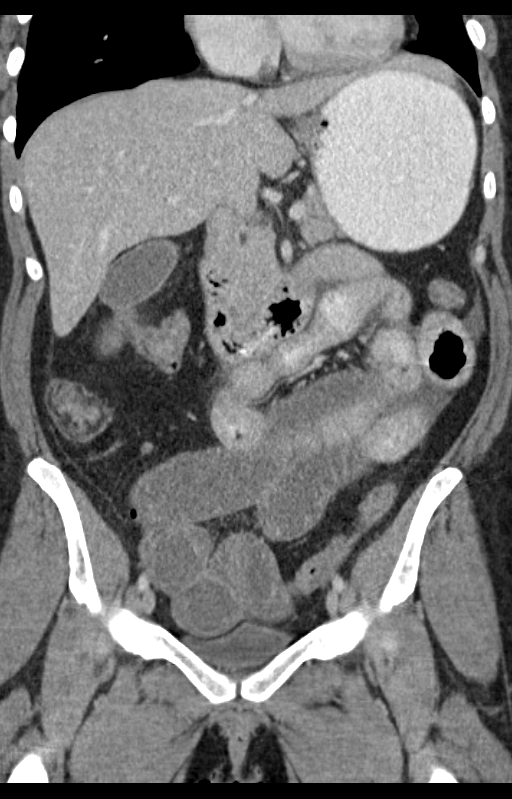

[15 of 46 positions shown; findings below may reference images not displayed]

FINDINGS: Rounded soft tissue density noted peripherally in the right lung
base measuring 19 mm on image 14. Triangular density peripherally at
the left lung base likely reflects scarring. No pleural effusions.
Heart is normal size.

Mild diffuse fatty infiltration of the liver. No focal abnormality.
Gallbladder, spleen, pancreas, left adrenal and kidneys are normal.
Small nodule in the right adrenal gland measures 14 mm and is
nonspecific but likely reflects a small adenoma.

There are dilated small bowel loops with air-fluid levels. There is
an umbilical hernia containing a knuckle of small bowel, likely the
cause of the bowel obstruction. In addition, the bowel within the
hernia is thick walled as well as bowel just proximal and distal to
the hernia. This is concerning for incarceration and at risk bowel.
The colon is decompressed.

There is a small amount of free fluid in the left paracolic gutter
and within the mesentery. Small scattered mesenteric lymph nodes,
none pathologically enlarged. Aorta is normal caliber.

Prior hysterectomy. No adnexal masses. Urinary bladder is
unremarkable.

No acute bony abnormality or focal bone lesion.
IMPRESSION: Umbilical hernia containing small bowel loop with small bowel
obstruction. In addition, the herniated bowel is thick walled as is
bowel just proximal to and distal to the hernia. This is concerning
for incarceration and at-risk bowel.

19 mm peripheral nodule at the right lung base. This warrants
follow-up with repeat chest CT in 6 months to assess for stability
or change.

Small right adrenal nodule, nonspecific but most likely small
adenoma.

## 2016-11-02 ENCOUNTER — Encounter: Payer: Self-pay | Admitting: Emergency Medicine

## 2016-11-02 ENCOUNTER — Emergency Department: Payer: No Typology Code available for payment source

## 2016-11-02 ENCOUNTER — Emergency Department
Admission: EM | Admit: 2016-11-02 | Discharge: 2016-11-02 | Disposition: A | Discharge status: Home / Self Care | Payer: No Typology Code available for payment source | Attending: Emergency Medicine | Admitting: Emergency Medicine

## 2016-11-02 DIAGNOSIS — F17210 Nicotine dependence, cigarettes, uncomplicated: Secondary | ICD-10-CM | POA: Diagnosis not present

## 2016-11-02 DIAGNOSIS — K439 Ventral hernia without obstruction or gangrene: Secondary | ICD-10-CM | POA: Insufficient documentation

## 2016-11-02 DIAGNOSIS — Z79899 Other long term (current) drug therapy: Secondary | ICD-10-CM | POA: Diagnosis not present

## 2016-11-02 DIAGNOSIS — R1030 Lower abdominal pain, unspecified: Secondary | ICD-10-CM | POA: Diagnosis present

## 2016-11-02 DIAGNOSIS — K5900 Constipation, unspecified: Secondary | ICD-10-CM | POA: Insufficient documentation

## 2016-11-02 DIAGNOSIS — I10 Essential (primary) hypertension: Secondary | ICD-10-CM | POA: Diagnosis not present

## 2016-11-02 HISTORY — DX: Ventral hernia without obstruction or gangrene: K43.9

## 2016-11-02 LAB — CBC WITH DIFFERENTIAL/PLATELET
Basophils Absolute: 0.1 10*3/uL (ref 0–0.1)
Basophils Relative: 1 %
EOS ABS: 0.2 10*3/uL (ref 0–0.7)
EOS PCT: 1 %
HCT: 38.5 % (ref 35.0–47.0)
Hemoglobin: 13.4 g/dL (ref 12.0–16.0)
LYMPHS ABS: 2.7 10*3/uL (ref 1.0–3.6)
Lymphocytes Relative: 21 %
MCH: 31.5 pg (ref 26.0–34.0)
MCHC: 34.8 g/dL (ref 32.0–36.0)
MCV: 90.6 fL (ref 80.0–100.0)
Monocytes Absolute: 0.8 10*3/uL (ref 0.2–0.9)
Monocytes Relative: 6 %
Neutro Abs: 9.1 10*3/uL — ABNORMAL HIGH (ref 1.4–6.5)
Neutrophils Relative %: 71 %
PLATELETS: 273 10*3/uL (ref 150–440)
RBC: 4.25 MIL/uL (ref 3.80–5.20)
RDW: 13.8 % (ref 11.5–14.5)
WBC: 12.8 10*3/uL — ABNORMAL HIGH (ref 3.6–11.0)

## 2016-11-02 LAB — URINALYSIS, COMPLETE (UACMP) WITH MICROSCOPIC
BACTERIA UA: NONE SEEN
BILIRUBIN URINE: NEGATIVE
GLUCOSE, UA: NEGATIVE mg/dL
Ketones, ur: NEGATIVE mg/dL
LEUKOCYTES UA: NEGATIVE
NITRITE: NEGATIVE
PH: 5 (ref 5.0–8.0)
Protein, ur: NEGATIVE mg/dL
SPECIFIC GRAVITY, URINE: 1.018 (ref 1.005–1.030)

## 2016-11-02 LAB — COMPREHENSIVE METABOLIC PANEL
ALT: 14 U/L (ref 14–54)
ANION GAP: 8 (ref 5–15)
AST: 17 U/L (ref 15–41)
Albumin: 3.8 g/dL (ref 3.5–5.0)
Alkaline Phosphatase: 44 U/L (ref 38–126)
BUN: 10 mg/dL (ref 6–20)
CALCIUM: 8.8 mg/dL — AB (ref 8.9–10.3)
CHLORIDE: 104 mmol/L (ref 101–111)
CO2: 23 mmol/L (ref 22–32)
Creatinine, Ser: 0.92 mg/dL (ref 0.44–1.00)
Glucose, Bld: 96 mg/dL (ref 65–99)
Potassium: 3.6 mmol/L (ref 3.5–5.1)
Sodium: 135 mmol/L (ref 135–145)
Total Bilirubin: 0.6 mg/dL (ref 0.3–1.2)
Total Protein: 7.2 g/dL (ref 6.5–8.1)

## 2016-11-02 LAB — POCT PREGNANCY, URINE: PREG TEST UR: NEGATIVE

## 2016-11-02 MED ORDER — OXYCODONE-ACETAMINOPHEN 5-325 MG PO TABS
2.0000 | ORAL_TABLET | Freq: Once | ORAL | Status: AC
  Administered 2016-11-02: 2 via ORAL
  Filled 2016-11-02: qty 2

## 2016-11-02 MED ORDER — IOPAMIDOL (ISOVUE-300) INJECTION 61%
30.0000 mL | Freq: Once | INTRAVENOUS | Status: AC | PRN
Start: 2016-11-02 — End: 2016-11-02
  Administered 2016-11-02: 30 mL via ORAL

## 2016-11-02 MED ORDER — MORPHINE SULFATE (PF) 4 MG/ML IV SOLN
4.0000 mg | Freq: Once | INTRAVENOUS | Status: AC
  Administered 2016-11-02: 4 mg via INTRAVENOUS
  Filled 2016-11-02: qty 1

## 2016-11-02 MED ORDER — IOPAMIDOL (ISOVUE-300) INJECTION 61%
100.0000 mL | Freq: Once | INTRAVENOUS | Status: AC | PRN
  Administered 2016-11-02: 100 mL via INTRAVENOUS

## 2016-11-02 MED ORDER — OXYCODONE-ACETAMINOPHEN 5-325 MG PO TABS: 1.0000 | ORAL_TABLET | Freq: Four times a day (QID) | ORAL | 0 refills | Status: AC | PRN

## 2016-11-02 MED ORDER — POLYETHYLENE GLYCOL 3350 17 G PO PACK: 17.0000 g | PACK | Freq: Every day | ORAL | 0 refills | Status: AC

## 2016-11-02 NOTE — ED Provider Notes (Signed)
[facility] Emergency Department Provider Note       Time seen: ----------------------------------------- 10:40 AM on 11/02/2016 -----------------------------------------     I have reviewed the triage vital signs and the nursing notes.   HISTORY   Chief Complaint Hernia    HPI Theresa Sandoval is a 41 y.o. female who presents to the ED for a hernia. Patient states she discovered the hernia about a year ago and was seen by a general surgeon here but no surgery was indicated at that point. Patient describes recent constipation but otherwise denies any straining or exacerbation of her hernia. She denies fevers, chills, chest pain, shortness of breath or vomiting. Patient describes tenderness in the lower abdomen to the right of midline.   Past Medical History:  Diagnosis Date  . Depression   . Fibroids   . Hernia of abdominal wall   . Hypertension   . Insomnia   . Vitamin D deficiency     Patient Active Problem List   Diagnosis Date Noted  . Status post abdominal hysterectomy 11/12/2015  . History of ventral hernia repair 10/12/2015  . Small bowel obstruction   . S/P abdominal hysterectomy 09/30/2015  . 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 History of menorrhagia 04/10/2015  . Vulvar lesion 04/10/2015  . Prediabetes 03/15/2015  . Vitamin D deficiency 03/15/2015  . Tobacco abuse 03/15/2015  . History of uterine fibroid 02/19/2015  . Obesity (BMI 30.0-34.9) 02/01/2015  . Anxiety and depression 02/01/2015  . Insomnia 02/01/2015  . EKG, abnormal 02/01/2015  . Microalbuminuria 02/01/2015    Past Surgical History:  Procedure Laterality Date  . [place]  . TUBAL LIGATION    . VENTRAL HERNIA REPAIR N/A 10/12/2015   Procedure: Repair of incarcerated ventral hernia ;  Surgeon: Florene Glen, MD;  Location: [facility] ORS;  Service: General;  Laterality: N/A;  . vuvlar lesion       Allergies Patient has no known allergies.  Social History Social History  Substance Use Topics  . Smoking status: Current Every Day Smoker    Packs/day: 0.50    Years: 15.00    Types: Cigarettes    Start date: 02/01/2000  . Smokeless tobacco: Never Used  . Alcohol use 2.4 oz/week    4 Standard drinks or equivalent per week     Comment: wine/beer     Review of Systems Constitutional: Negative for fever. Cardiovascular: Negative for chest pain. Respiratory: Negative for shortness of breath. Gastrointestinal:Positive for abdominal pain, constipation Genitourinary: Negative for dysuria. Musculoskeletal: Negative for back pain. Skin: Negative for rash. Neurological: Negative for headaches, focal weakness or numbness.  10-point ROS otherwise negative.  ____________________________________________   PHYSICAL EXAM:  VITAL SIGNS: ED Triage Vitals  Enc Vitals Group     BP 11/02/16 0754 (!) 168/98     Pulse Rate 11/02/16 0754 78     Resp 11/02/16 0754 20     Temp 11/02/16 0754 98.9 F (37.2 C)     Temp src --      SpO2 11/02/16 0754 99 %     Weight 11/02/16 0755 205 lb (93 kg)     Height --      Head Circumference --      Peak Flow --      Pain Score 11/02/16 0754 9     Pain Loc --      Pain Edu? --      Excl. in  GC? --     Constitutional: Alert and oriented. Well appearing and in no distress. Eyes: Conjunctivae are normal. PERRL. Normal extraocular movements. ENT   Head: Normocephalic and atraumatic.   Nose: No congestion/rhinnorhea.   Mouth/Throat: Mucous membranes are moist.   Neck: No stridor. Cardiovascular: Normal rate, regular rhythm. No murmurs, rubs, or gallops. Respiratory: Normal respiratory effort without tachypnea nor retractions. Breath sounds are clear and equal bilaterally. No wheezes/rales/rhonchi. Gastrointestinal: Mild lower abdominal tenderness, small reducible ventral hernia along the right lower paramedian space. Previous  laparotomy scar is evident Musculoskeletal: Nontender with normal range of motion in extremities. No lower extremity tenderness nor edema. Neurologic:  Normal speech and language. No gross focal neurologic deficits are appreciated.  Skin:  Skin is warm, dry and intact. No rash noted. Psychiatric: Mood and affect are normal. Speech and behavior are normal.  ____________________________________________  ED COURSE:  Pertinent labs & imaging results that were available during my care of the patient were reviewed by me and considered in my medical decision making (see chart for details). Patient presents for abdominal pain and possible ventral hernia, we will assess with labs and imaging as indicated.   Procedures ____________________________________________   LABS (pertinent positives/negatives)  Labs Reviewed  CBC WITH DIFFERENTIAL/PLATELET - Abnormal; Notable for the following:       Result Value   WBC 12.8 (*)    Neutro Abs 9.1 (*)    All other components within normal limits  COMPREHENSIVE METABOLIC PANEL - Abnormal; Notable for the following:    Calcium 8.8 (*)    All other components within normal limits  URINALYSIS, COMPLETE (UACMP) WITH MICROSCOPIC - Abnormal; Notable for the following:    Color, Urine YELLOW (*)    APPearance CLOUDY (*)    Hgb urine dipstick SMALL (*)    Squamous Epithelial / LPF 6-30 (*)    All other components within normal limits  POC URINE PREG, ED  POCT PREGNANCY, URINE    RADIOLOGY  CT of the abdomen and pelvis with contrast IMPRESSION:  1. Fat and small bowel containing ventral hernia within the  infraumbilical region without associated obstruction or  inflammation.  2. Second smaller fat containing left paraumbilical hernia.  3. 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  4. 14 mm right adrenal nodule, stable from prior, and likely a small   adenoma.  5. Bilateral ovarian cysts measuring up to 3.4 cm, likely  physiologic cyst.    ____________________________________________  FINAL ASSESSMENT AND PLAN  Abdominal pain, ventral hernia  Plan: Patient's labs and imaging were dictated above. Patient had presented for Abdominal pain with resulting CT showing Fat and small bowel containing hernia without obstruction or inflammation. I will refer her back to general surgery for outpatient follow-up.   Earleen Newport, MD   Note: This note was generated in part or whole with voice recognition software. Voice recognition is usually quite accurate but there are transcription errors that can and very often do occur. I apologize for any typographical errors that were not detected and corrected.     Earleen Newport, MD 11/02/16 1254

## 2016-11-02 NOTE — ED Triage Notes (Signed)
Pt with hernia, started bothering her yesterday. Discovered about one year ago and was seen by surgeon but no surgery needed.

## 2017-05-13 ENCOUNTER — Encounter: Payer: PRIVATE HEALTH INSURANCE | Admitting: Obstetrics and Gynecology

## 2019-12-08 ENCOUNTER — Ambulatory Visit: Payer: BLUE CROSS/BLUE SHIELD | Attending: Internal Medicine

## 2019-12-08 DIAGNOSIS — Z23 Encounter for immunization: Secondary | ICD-10-CM

## 2019-12-08 NOTE — Progress Notes (Signed)
   Covid-19 Vaccination Clinic  Name:  Theresa Sandoval    MRN: [id number] DOB: July 10, 1976  12/08/2019  Theresa Sandoval was observed post Covid-19 immunization for 15 minutes without incident. She was provided with Vaccine Information Sheet and instruction to access the V-Safe system.   Theresa Sandoval was instructed to call 911 with any severe reactions post vaccine: Marland Kitchen Difficulty breathing  . Swelling of face and throat  . A fast heartbeat  . A bad rash all over body  . Dizziness and weakness   Immunizations Administered    Name Date Dose VIS Date Route   Pfizer COVID-19 Vaccine 12/08/2019 11:20 AM 0.3 mL 09/27/2018 Intramuscular   Manufacturer: [place]   Lot: [id number]   [place]: [id number]

## 2020-01-02 ENCOUNTER — Ambulatory Visit: Payer: BLUE CROSS/BLUE SHIELD | Attending: Internal Medicine

## 2020-01-02 DIAGNOSIS — Z23 Encounter for immunization: Secondary | ICD-10-CM

## 2020-01-02 NOTE — Progress Notes (Signed)
   Covid-19 Vaccination Clinic  Name:  Theresa Sandoval    MRN: [id number] DOB: 02/04/1976  01/02/2020  [name] was observed post Covid-19 immunization for 15 minutes without incident. She was provided with Vaccine Information Sheet and instruction to access the V-Safe system.   [name] was instructed to call 911 with any severe reactions post vaccine: Marland Kitchen Difficulty breathing  . Swelling of face and throat  . A fast heartbeat  . A bad rash all over body  . Dizziness and weakness   Immunizations Administered    Name Date Dose VIS Date Route   Pfizer COVID-19 Vaccine 01/02/2020  3:36 PM 0.3 mL 09/27/2018 Intramuscular   Manufacturer: [place]   Lot: [id number]   [place]: [id number]
